# Patient Record
Sex: Female | Born: 1935
Health system: Southern US, Community
[De-identification: ages and names within clinical notes are randomized; demographics above are authoritative.]

## PROBLEM LIST (undated history)

## (undated) DIAGNOSIS — I509 Heart failure, unspecified: Secondary | ICD-10-CM

## (undated) DIAGNOSIS — K56609 Unspecified intestinal obstruction, unspecified as to partial versus complete obstruction: Secondary | ICD-10-CM

## (undated) DIAGNOSIS — N289 Disorder of kidney and ureter, unspecified: Secondary | ICD-10-CM

## (undated) DIAGNOSIS — Z87442 Personal history of urinary calculi: Secondary | ICD-10-CM

## (undated) DIAGNOSIS — Z950 Presence of cardiac pacemaker: Secondary | ICD-10-CM

## (undated) DIAGNOSIS — C801 Malignant (primary) neoplasm, unspecified: Secondary | ICD-10-CM

## (undated) HISTORY — PX: BREAST LUMPECTOMY: SHX2

## (undated) HISTORY — PX: GASTRIC BYPASS: SHX52

## (undated) HISTORY — PX: PACEMAKER IMPLANT: EP1218

## (undated) HISTORY — PX: BACK SURGERY: SHX140

---
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
## 2013-08-05 DIAGNOSIS — Z950 Presence of cardiac pacemaker: Secondary | ICD-10-CM

## 2013-08-05 HISTORY — DX: Presence of cardiac pacemaker: Z95.0

## 2014-06-07 HISTORY — PX: CARDIAC VALVE REPLACEMENT: SHX585

## 2015-06-08 HISTORY — PX: FRACTURE SURGERY: SHX138

## 2016-06-16 DIAGNOSIS — M546 Pain in thoracic spine: Secondary | ICD-10-CM | POA: Diagnosis not present

## 2016-06-16 DIAGNOSIS — M4134 Thoracogenic scoliosis, thoracic region: Secondary | ICD-10-CM | POA: Diagnosis not present

## 2016-06-21 DIAGNOSIS — M546 Pain in thoracic spine: Secondary | ICD-10-CM | POA: Diagnosis not present

## 2016-06-21 DIAGNOSIS — M4134 Thoracogenic scoliosis, thoracic region: Secondary | ICD-10-CM | POA: Diagnosis not present

## 2016-06-24 DIAGNOSIS — I872 Venous insufficiency (chronic) (peripheral): Secondary | ICD-10-CM | POA: Diagnosis not present

## 2016-06-24 DIAGNOSIS — K219 Gastro-esophageal reflux disease without esophagitis: Secondary | ICD-10-CM | POA: Diagnosis not present

## 2016-06-24 DIAGNOSIS — I482 Chronic atrial fibrillation: Secondary | ICD-10-CM | POA: Diagnosis not present

## 2016-06-24 DIAGNOSIS — I350 Nonrheumatic aortic (valve) stenosis: Secondary | ICD-10-CM | POA: Diagnosis not present

## 2016-06-24 DIAGNOSIS — Z7409 Other reduced mobility: Secondary | ICD-10-CM | POA: Diagnosis not present

## 2016-06-25 DIAGNOSIS — M4134 Thoracogenic scoliosis, thoracic region: Secondary | ICD-10-CM | POA: Diagnosis not present

## 2016-06-25 DIAGNOSIS — M546 Pain in thoracic spine: Secondary | ICD-10-CM | POA: Diagnosis not present

## 2016-06-30 DIAGNOSIS — I872 Venous insufficiency (chronic) (peripheral): Secondary | ICD-10-CM | POA: Diagnosis not present

## 2016-06-30 DIAGNOSIS — I482 Chronic atrial fibrillation: Secondary | ICD-10-CM | POA: Diagnosis not present

## 2016-06-30 DIAGNOSIS — L89102 Pressure ulcer of unspecified part of back, stage 2: Secondary | ICD-10-CM | POA: Diagnosis not present

## 2016-06-30 DIAGNOSIS — D510 Vitamin B12 deficiency anemia due to intrinsic factor deficiency: Secondary | ICD-10-CM | POA: Diagnosis not present

## 2016-06-30 DIAGNOSIS — R0602 Shortness of breath: Secondary | ICD-10-CM | POA: Diagnosis not present

## 2016-06-30 DIAGNOSIS — I350 Nonrheumatic aortic (valve) stenosis: Secondary | ICD-10-CM | POA: Diagnosis not present

## 2016-07-02 DIAGNOSIS — M546 Pain in thoracic spine: Secondary | ICD-10-CM | POA: Diagnosis not present

## 2016-07-02 DIAGNOSIS — M4134 Thoracogenic scoliosis, thoracic region: Secondary | ICD-10-CM | POA: Diagnosis not present

## 2016-07-07 DIAGNOSIS — M4134 Thoracogenic scoliosis, thoracic region: Secondary | ICD-10-CM | POA: Diagnosis not present

## 2016-07-07 DIAGNOSIS — I482 Chronic atrial fibrillation: Secondary | ICD-10-CM | POA: Diagnosis not present

## 2016-07-07 DIAGNOSIS — M546 Pain in thoracic spine: Secondary | ICD-10-CM | POA: Diagnosis not present

## 2016-07-21 DIAGNOSIS — I482 Chronic atrial fibrillation: Secondary | ICD-10-CM | POA: Diagnosis not present

## 2016-07-26 DIAGNOSIS — I482 Chronic atrial fibrillation: Secondary | ICD-10-CM | POA: Diagnosis not present

## 2016-07-26 DIAGNOSIS — I5032 Chronic diastolic (congestive) heart failure: Secondary | ICD-10-CM | POA: Diagnosis not present

## 2016-07-26 DIAGNOSIS — K219 Gastro-esophageal reflux disease without esophagitis: Secondary | ICD-10-CM | POA: Diagnosis not present

## 2016-07-26 DIAGNOSIS — D510 Vitamin B12 deficiency anemia due to intrinsic factor deficiency: Secondary | ICD-10-CM | POA: Diagnosis not present

## 2016-07-26 DIAGNOSIS — M79644 Pain in right finger(s): Secondary | ICD-10-CM | POA: Diagnosis not present

## 2016-08-11 DIAGNOSIS — G5601 Carpal tunnel syndrome, right upper limb: Secondary | ICD-10-CM | POA: Diagnosis not present

## 2016-08-11 DIAGNOSIS — M1811 Unilateral primary osteoarthritis of first carpometacarpal joint, right hand: Secondary | ICD-10-CM | POA: Diagnosis not present

## 2016-08-23 DIAGNOSIS — M79644 Pain in right finger(s): Secondary | ICD-10-CM | POA: Diagnosis not present

## 2016-08-23 DIAGNOSIS — K219 Gastro-esophageal reflux disease without esophagitis: Secondary | ICD-10-CM | POA: Diagnosis not present

## 2016-08-23 DIAGNOSIS — D510 Vitamin B12 deficiency anemia due to intrinsic factor deficiency: Secondary | ICD-10-CM | POA: Diagnosis not present

## 2016-08-23 DIAGNOSIS — I482 Chronic atrial fibrillation: Secondary | ICD-10-CM | POA: Diagnosis not present

## 2016-08-23 DIAGNOSIS — E785 Hyperlipidemia, unspecified: Secondary | ICD-10-CM | POA: Diagnosis not present

## 2016-08-31 DIAGNOSIS — I4891 Unspecified atrial fibrillation: Secondary | ICD-10-CM | POA: Diagnosis not present

## 2016-08-31 DIAGNOSIS — T454X5A Adverse effect of iron and its compounds, initial encounter: Secondary | ICD-10-CM | POA: Diagnosis not present

## 2016-08-31 DIAGNOSIS — D508 Other iron deficiency anemias: Secondary | ICD-10-CM | POA: Diagnosis not present

## 2016-08-31 DIAGNOSIS — Z7901 Long term (current) use of anticoagulants: Secondary | ICD-10-CM | POA: Diagnosis not present

## 2016-08-31 DIAGNOSIS — D509 Iron deficiency anemia, unspecified: Secondary | ICD-10-CM | POA: Diagnosis not present

## 2016-08-31 DIAGNOSIS — D519 Vitamin B12 deficiency anemia, unspecified: Secondary | ICD-10-CM | POA: Diagnosis not present

## 2016-08-31 DIAGNOSIS — Z950 Presence of cardiac pacemaker: Secondary | ICD-10-CM | POA: Diagnosis not present

## 2016-08-31 DIAGNOSIS — Z7983 Long term (current) use of bisphosphonates: Secondary | ICD-10-CM | POA: Diagnosis not present

## 2016-08-31 DIAGNOSIS — K9049 Malabsorption due to intolerance, not elsewhere classified: Secondary | ICD-10-CM | POA: Diagnosis not present

## 2016-08-31 DIAGNOSIS — M810 Age-related osteoporosis without current pathological fracture: Secondary | ICD-10-CM | POA: Diagnosis not present

## 2016-09-15 DIAGNOSIS — E785 Hyperlipidemia, unspecified: Secondary | ICD-10-CM | POA: Diagnosis not present

## 2016-09-15 DIAGNOSIS — N390 Urinary tract infection, site not specified: Secondary | ICD-10-CM | POA: Diagnosis not present

## 2016-09-15 DIAGNOSIS — M2550 Pain in unspecified joint: Secondary | ICD-10-CM | POA: Diagnosis not present

## 2016-09-15 DIAGNOSIS — D510 Vitamin B12 deficiency anemia due to intrinsic factor deficiency: Secondary | ICD-10-CM | POA: Diagnosis not present

## 2016-09-15 DIAGNOSIS — I482 Chronic atrial fibrillation: Secondary | ICD-10-CM | POA: Diagnosis not present

## 2016-09-15 DIAGNOSIS — K219 Gastro-esophageal reflux disease without esophagitis: Secondary | ICD-10-CM | POA: Diagnosis not present

## 2016-09-28 DIAGNOSIS — L578 Other skin changes due to chronic exposure to nonionizing radiation: Secondary | ICD-10-CM | POA: Diagnosis not present

## 2016-09-28 DIAGNOSIS — L821 Other seborrheic keratosis: Secondary | ICD-10-CM | POA: Diagnosis not present

## 2016-09-28 DIAGNOSIS — Z85828 Personal history of other malignant neoplasm of skin: Secondary | ICD-10-CM | POA: Diagnosis not present

## 2016-09-28 DIAGNOSIS — D485 Neoplasm of uncertain behavior of skin: Secondary | ICD-10-CM | POA: Diagnosis not present

## 2016-09-28 DIAGNOSIS — C44622 Squamous cell carcinoma of skin of right upper limb, including shoulder: Secondary | ICD-10-CM | POA: Diagnosis not present

## 2016-09-28 DIAGNOSIS — L853 Xerosis cutis: Secondary | ICD-10-CM | POA: Diagnosis not present

## 2016-09-28 DIAGNOSIS — C44329 Squamous cell carcinoma of skin of other parts of face: Secondary | ICD-10-CM | POA: Diagnosis not present

## 2016-09-28 DIAGNOSIS — L570 Actinic keratosis: Secondary | ICD-10-CM | POA: Diagnosis not present

## 2016-10-13 DIAGNOSIS — D510 Vitamin B12 deficiency anemia due to intrinsic factor deficiency: Secondary | ICD-10-CM | POA: Diagnosis not present

## 2016-10-13 DIAGNOSIS — M1811 Unilateral primary osteoarthritis of first carpometacarpal joint, right hand: Secondary | ICD-10-CM | POA: Diagnosis not present

## 2016-10-13 DIAGNOSIS — I482 Chronic atrial fibrillation: Secondary | ICD-10-CM | POA: Diagnosis not present

## 2016-10-13 DIAGNOSIS — I5032 Chronic diastolic (congestive) heart failure: Secondary | ICD-10-CM | POA: Diagnosis not present

## 2016-10-13 DIAGNOSIS — E785 Hyperlipidemia, unspecified: Secondary | ICD-10-CM | POA: Diagnosis not present

## 2016-10-13 DIAGNOSIS — K219 Gastro-esophageal reflux disease without esophagitis: Secondary | ICD-10-CM | POA: Diagnosis not present

## 2016-10-13 DIAGNOSIS — G5601 Carpal tunnel syndrome, right upper limb: Secondary | ICD-10-CM | POA: Diagnosis not present

## 2016-10-21 DIAGNOSIS — I482 Chronic atrial fibrillation: Secondary | ICD-10-CM | POA: Diagnosis not present

## 2016-10-21 DIAGNOSIS — I350 Nonrheumatic aortic (valve) stenosis: Secondary | ICD-10-CM | POA: Diagnosis not present

## 2016-10-21 DIAGNOSIS — I5032 Chronic diastolic (congestive) heart failure: Secondary | ICD-10-CM | POA: Diagnosis not present

## 2016-10-21 DIAGNOSIS — I10 Essential (primary) hypertension: Secondary | ICD-10-CM | POA: Diagnosis not present

## 2016-10-26 DIAGNOSIS — C44320 Squamous cell carcinoma of skin of unspecified parts of face: Secondary | ICD-10-CM | POA: Diagnosis not present

## 2016-10-27 DIAGNOSIS — M1811 Unilateral primary osteoarthritis of first carpometacarpal joint, right hand: Secondary | ICD-10-CM | POA: Diagnosis not present

## 2016-10-27 DIAGNOSIS — G5601 Carpal tunnel syndrome, right upper limb: Secondary | ICD-10-CM | POA: Diagnosis not present

## 2016-11-03 DIAGNOSIS — L905 Scar conditions and fibrosis of skin: Secondary | ICD-10-CM | POA: Diagnosis not present

## 2016-11-03 DIAGNOSIS — C44622 Squamous cell carcinoma of skin of right upper limb, including shoulder: Secondary | ICD-10-CM | POA: Diagnosis not present

## 2016-11-11 DIAGNOSIS — I482 Chronic atrial fibrillation: Secondary | ICD-10-CM | POA: Diagnosis not present

## 2016-11-11 DIAGNOSIS — K219 Gastro-esophageal reflux disease without esophagitis: Secondary | ICD-10-CM | POA: Diagnosis not present

## 2016-11-11 DIAGNOSIS — Z01818 Encounter for other preprocedural examination: Secondary | ICD-10-CM | POA: Diagnosis not present

## 2016-11-11 DIAGNOSIS — D510 Vitamin B12 deficiency anemia due to intrinsic factor deficiency: Secondary | ICD-10-CM | POA: Diagnosis not present

## 2016-11-11 DIAGNOSIS — E785 Hyperlipidemia, unspecified: Secondary | ICD-10-CM | POA: Diagnosis not present

## 2016-11-11 DIAGNOSIS — M810 Age-related osteoporosis without current pathological fracture: Secondary | ICD-10-CM | POA: Diagnosis not present

## 2016-11-11 DIAGNOSIS — I5032 Chronic diastolic (congestive) heart failure: Secondary | ICD-10-CM | POA: Diagnosis not present

## 2016-11-11 DIAGNOSIS — M2550 Pain in unspecified joint: Secondary | ICD-10-CM | POA: Diagnosis not present

## 2016-11-11 DIAGNOSIS — M1811 Unilateral primary osteoarthritis of first carpometacarpal joint, right hand: Secondary | ICD-10-CM | POA: Diagnosis not present

## 2016-11-17 DIAGNOSIS — G5601 Carpal tunnel syndrome, right upper limb: Secondary | ICD-10-CM | POA: Diagnosis not present

## 2016-11-17 DIAGNOSIS — M1811 Unilateral primary osteoarthritis of first carpometacarpal joint, right hand: Secondary | ICD-10-CM | POA: Diagnosis not present

## 2016-11-23 DIAGNOSIS — Z538 Procedure and treatment not carried out for other reasons: Secondary | ICD-10-CM | POA: Diagnosis not present

## 2016-11-23 DIAGNOSIS — M1990 Unspecified osteoarthritis, unspecified site: Secondary | ICD-10-CM | POA: Diagnosis not present

## 2016-12-06 DIAGNOSIS — D510 Vitamin B12 deficiency anemia due to intrinsic factor deficiency: Secondary | ICD-10-CM | POA: Diagnosis not present

## 2016-12-06 DIAGNOSIS — I5032 Chronic diastolic (congestive) heart failure: Secondary | ICD-10-CM | POA: Diagnosis not present

## 2016-12-06 DIAGNOSIS — I482 Chronic atrial fibrillation: Secondary | ICD-10-CM | POA: Diagnosis not present

## 2016-12-06 DIAGNOSIS — K219 Gastro-esophageal reflux disease without esophagitis: Secondary | ICD-10-CM | POA: Diagnosis not present

## 2016-12-07 DIAGNOSIS — I110 Hypertensive heart disease with heart failure: Secondary | ICD-10-CM | POA: Diagnosis not present

## 2016-12-07 DIAGNOSIS — M898X4 Other specified disorders of bone, hand: Secondary | ICD-10-CM | POA: Diagnosis not present

## 2016-12-07 DIAGNOSIS — G8918 Other acute postprocedural pain: Secondary | ICD-10-CM | POA: Diagnosis not present

## 2016-12-07 DIAGNOSIS — M654 Radial styloid tenosynovitis [de Quervain]: Secondary | ICD-10-CM | POA: Diagnosis not present

## 2016-12-07 DIAGNOSIS — I509 Heart failure, unspecified: Secondary | ICD-10-CM | POA: Diagnosis not present

## 2016-12-07 DIAGNOSIS — M1811 Unilateral primary osteoarthritis of first carpometacarpal joint, right hand: Secondary | ICD-10-CM | POA: Diagnosis not present

## 2016-12-29 DIAGNOSIS — Z4889 Encounter for other specified surgical aftercare: Secondary | ICD-10-CM | POA: Diagnosis not present

## 2017-01-03 DIAGNOSIS — M79641 Pain in right hand: Secondary | ICD-10-CM | POA: Diagnosis not present

## 2017-01-03 DIAGNOSIS — H6983 Other specified disorders of Eustachian tube, bilateral: Secondary | ICD-10-CM | POA: Diagnosis not present

## 2017-01-03 DIAGNOSIS — M25641 Stiffness of right hand, not elsewhere classified: Secondary | ICD-10-CM | POA: Diagnosis not present

## 2017-01-03 DIAGNOSIS — I482 Chronic atrial fibrillation: Secondary | ICD-10-CM | POA: Diagnosis not present

## 2017-01-03 DIAGNOSIS — D510 Vitamin B12 deficiency anemia due to intrinsic factor deficiency: Secondary | ICD-10-CM | POA: Diagnosis not present

## 2017-01-03 DIAGNOSIS — M79644 Pain in right finger(s): Secondary | ICD-10-CM | POA: Diagnosis not present

## 2017-01-03 DIAGNOSIS — I5032 Chronic diastolic (congestive) heart failure: Secondary | ICD-10-CM | POA: Diagnosis not present

## 2017-01-03 DIAGNOSIS — Z6825 Body mass index (BMI) 25.0-25.9, adult: Secondary | ICD-10-CM | POA: Diagnosis not present

## 2017-01-03 DIAGNOSIS — M1811 Unilateral primary osteoarthritis of first carpometacarpal joint, right hand: Secondary | ICD-10-CM | POA: Diagnosis not present

## 2017-01-03 DIAGNOSIS — K219 Gastro-esophageal reflux disease without esophagitis: Secondary | ICD-10-CM | POA: Diagnosis not present

## 2017-01-12 DIAGNOSIS — M25641 Stiffness of right hand, not elsewhere classified: Secondary | ICD-10-CM | POA: Diagnosis not present

## 2017-01-12 DIAGNOSIS — M1811 Unilateral primary osteoarthritis of first carpometacarpal joint, right hand: Secondary | ICD-10-CM | POA: Diagnosis not present

## 2017-01-12 DIAGNOSIS — M79641 Pain in right hand: Secondary | ICD-10-CM | POA: Diagnosis not present

## 2017-01-12 DIAGNOSIS — M79644 Pain in right finger(s): Secondary | ICD-10-CM | POA: Diagnosis not present

## 2017-01-19 DIAGNOSIS — M79644 Pain in right finger(s): Secondary | ICD-10-CM | POA: Diagnosis not present

## 2017-01-19 DIAGNOSIS — M1811 Unilateral primary osteoarthritis of first carpometacarpal joint, right hand: Secondary | ICD-10-CM | POA: Diagnosis not present

## 2017-01-19 DIAGNOSIS — M25641 Stiffness of right hand, not elsewhere classified: Secondary | ICD-10-CM | POA: Diagnosis not present

## 2017-01-19 DIAGNOSIS — M79641 Pain in right hand: Secondary | ICD-10-CM | POA: Diagnosis not present

## 2017-01-25 DIAGNOSIS — M1811 Unilateral primary osteoarthritis of first carpometacarpal joint, right hand: Secondary | ICD-10-CM | POA: Diagnosis not present

## 2017-01-25 DIAGNOSIS — M79641 Pain in right hand: Secondary | ICD-10-CM | POA: Diagnosis not present

## 2017-01-25 DIAGNOSIS — M25641 Stiffness of right hand, not elsewhere classified: Secondary | ICD-10-CM | POA: Diagnosis not present

## 2017-01-25 DIAGNOSIS — M79644 Pain in right finger(s): Secondary | ICD-10-CM | POA: Diagnosis not present

## 2017-01-26 DIAGNOSIS — L812 Freckles: Secondary | ICD-10-CM | POA: Diagnosis not present

## 2017-01-26 DIAGNOSIS — C44629 Squamous cell carcinoma of skin of left upper limb, including shoulder: Secondary | ICD-10-CM | POA: Diagnosis not present

## 2017-01-26 DIAGNOSIS — L570 Actinic keratosis: Secondary | ICD-10-CM | POA: Diagnosis not present

## 2017-01-26 DIAGNOSIS — C44329 Squamous cell carcinoma of skin of other parts of face: Secondary | ICD-10-CM | POA: Diagnosis not present

## 2017-01-26 DIAGNOSIS — C44219 Basal cell carcinoma of skin of left ear and external auricular canal: Secondary | ICD-10-CM | POA: Diagnosis not present

## 2017-01-26 DIAGNOSIS — L821 Other seborrheic keratosis: Secondary | ICD-10-CM | POA: Diagnosis not present

## 2017-01-26 DIAGNOSIS — Z85828 Personal history of other malignant neoplasm of skin: Secondary | ICD-10-CM | POA: Diagnosis not present

## 2017-01-26 DIAGNOSIS — L578 Other skin changes due to chronic exposure to nonionizing radiation: Secondary | ICD-10-CM | POA: Diagnosis not present

## 2017-01-26 DIAGNOSIS — D485 Neoplasm of uncertain behavior of skin: Secondary | ICD-10-CM | POA: Diagnosis not present

## 2017-01-31 DIAGNOSIS — I482 Chronic atrial fibrillation: Secondary | ICD-10-CM | POA: Diagnosis not present

## 2017-01-31 DIAGNOSIS — L89102 Pressure ulcer of unspecified part of back, stage 2: Secondary | ICD-10-CM | POA: Diagnosis not present

## 2017-01-31 DIAGNOSIS — I5032 Chronic diastolic (congestive) heart failure: Secondary | ICD-10-CM | POA: Diagnosis not present

## 2017-01-31 DIAGNOSIS — K219 Gastro-esophageal reflux disease without esophagitis: Secondary | ICD-10-CM | POA: Diagnosis not present

## 2017-01-31 DIAGNOSIS — Z6825 Body mass index (BMI) 25.0-25.9, adult: Secondary | ICD-10-CM | POA: Diagnosis not present

## 2017-01-31 DIAGNOSIS — Z0000 Encounter for general adult medical examination without abnormal findings: Secondary | ICD-10-CM | POA: Diagnosis not present

## 2017-01-31 DIAGNOSIS — D510 Vitamin B12 deficiency anemia due to intrinsic factor deficiency: Secondary | ICD-10-CM | POA: Diagnosis not present

## 2017-03-01 DIAGNOSIS — K9049 Malabsorption due to intolerance, not elsewhere classified: Secondary | ICD-10-CM | POA: Diagnosis not present

## 2017-03-01 DIAGNOSIS — D508 Other iron deficiency anemias: Secondary | ICD-10-CM | POA: Diagnosis not present

## 2017-03-01 DIAGNOSIS — M810 Age-related osteoporosis without current pathological fracture: Secondary | ICD-10-CM | POA: Diagnosis not present

## 2017-03-01 DIAGNOSIS — T454X5A Adverse effect of iron and its compounds, initial encounter: Secondary | ICD-10-CM | POA: Diagnosis not present

## 2017-03-02 DIAGNOSIS — I482 Chronic atrial fibrillation: Secondary | ICD-10-CM | POA: Diagnosis not present

## 2017-03-02 DIAGNOSIS — K219 Gastro-esophageal reflux disease without esophagitis: Secondary | ICD-10-CM | POA: Diagnosis not present

## 2017-03-02 DIAGNOSIS — E785 Hyperlipidemia, unspecified: Secondary | ICD-10-CM | POA: Diagnosis not present

## 2017-03-02 DIAGNOSIS — D510 Vitamin B12 deficiency anemia due to intrinsic factor deficiency: Secondary | ICD-10-CM | POA: Diagnosis not present

## 2017-03-02 DIAGNOSIS — I5032 Chronic diastolic (congestive) heart failure: Secondary | ICD-10-CM | POA: Diagnosis not present

## 2017-03-03 DIAGNOSIS — I350 Nonrheumatic aortic (valve) stenosis: Secondary | ICD-10-CM | POA: Diagnosis not present

## 2017-03-03 DIAGNOSIS — I482 Chronic atrial fibrillation: Secondary | ICD-10-CM | POA: Diagnosis not present

## 2017-03-03 DIAGNOSIS — I442 Atrioventricular block, complete: Secondary | ICD-10-CM | POA: Diagnosis not present

## 2017-03-03 DIAGNOSIS — I951 Orthostatic hypotension: Secondary | ICD-10-CM | POA: Diagnosis not present

## 2017-03-09 DIAGNOSIS — C44219 Basal cell carcinoma of skin of left ear and external auricular canal: Secondary | ICD-10-CM | POA: Diagnosis not present

## 2017-03-16 DIAGNOSIS — C44320 Squamous cell carcinoma of skin of unspecified parts of face: Secondary | ICD-10-CM | POA: Diagnosis not present

## 2017-03-18 DIAGNOSIS — Z780 Asymptomatic menopausal state: Secondary | ICD-10-CM | POA: Diagnosis not present

## 2017-03-23 DIAGNOSIS — D485 Neoplasm of uncertain behavior of skin: Secondary | ICD-10-CM | POA: Diagnosis not present

## 2017-03-23 DIAGNOSIS — L905 Scar conditions and fibrosis of skin: Secondary | ICD-10-CM | POA: Diagnosis not present

## 2017-03-23 DIAGNOSIS — C44629 Squamous cell carcinoma of skin of left upper limb, including shoulder: Secondary | ICD-10-CM | POA: Diagnosis not present

## 2017-03-30 DIAGNOSIS — M25641 Stiffness of right hand, not elsewhere classified: Secondary | ICD-10-CM | POA: Diagnosis not present

## 2017-03-30 DIAGNOSIS — M1811 Unilateral primary osteoarthritis of first carpometacarpal joint, right hand: Secondary | ICD-10-CM | POA: Diagnosis not present

## 2017-03-30 DIAGNOSIS — M79641 Pain in right hand: Secondary | ICD-10-CM | POA: Diagnosis not present

## 2017-03-30 DIAGNOSIS — M79644 Pain in right finger(s): Secondary | ICD-10-CM | POA: Diagnosis not present

## 2017-03-31 DIAGNOSIS — I5032 Chronic diastolic (congestive) heart failure: Secondary | ICD-10-CM | POA: Diagnosis not present

## 2017-03-31 DIAGNOSIS — M2550 Pain in unspecified joint: Secondary | ICD-10-CM | POA: Diagnosis not present

## 2017-03-31 DIAGNOSIS — G4700 Insomnia, unspecified: Secondary | ICD-10-CM | POA: Diagnosis not present

## 2017-03-31 DIAGNOSIS — M810 Age-related osteoporosis without current pathological fracture: Secondary | ICD-10-CM | POA: Diagnosis not present

## 2017-03-31 DIAGNOSIS — D510 Vitamin B12 deficiency anemia due to intrinsic factor deficiency: Secondary | ICD-10-CM | POA: Diagnosis not present

## 2017-03-31 DIAGNOSIS — L299 Pruritus, unspecified: Secondary | ICD-10-CM | POA: Diagnosis not present

## 2017-03-31 DIAGNOSIS — I482 Chronic atrial fibrillation: Secondary | ICD-10-CM | POA: Diagnosis not present

## 2017-03-31 DIAGNOSIS — K219 Gastro-esophageal reflux disease without esophagitis: Secondary | ICD-10-CM | POA: Diagnosis not present

## 2017-04-26 DIAGNOSIS — I482 Chronic atrial fibrillation: Secondary | ICD-10-CM | POA: Diagnosis not present

## 2017-04-26 DIAGNOSIS — D510 Vitamin B12 deficiency anemia due to intrinsic factor deficiency: Secondary | ICD-10-CM | POA: Diagnosis not present

## 2017-04-26 DIAGNOSIS — K219 Gastro-esophageal reflux disease without esophagitis: Secondary | ICD-10-CM | POA: Diagnosis not present

## 2017-05-16 DIAGNOSIS — C44629 Squamous cell carcinoma of skin of left upper limb, including shoulder: Secondary | ICD-10-CM | POA: Diagnosis not present

## 2017-06-02 DIAGNOSIS — M40204 Unspecified kyphosis, thoracic region: Secondary | ICD-10-CM | POA: Diagnosis not present

## 2017-06-02 DIAGNOSIS — N200 Calculus of kidney: Secondary | ICD-10-CM | POA: Diagnosis not present

## 2017-06-02 DIAGNOSIS — J189 Pneumonia, unspecified organism: Secondary | ICD-10-CM | POA: Diagnosis not present

## 2017-06-02 DIAGNOSIS — R509 Fever, unspecified: Secondary | ICD-10-CM | POA: Diagnosis not present

## 2017-06-02 DIAGNOSIS — Z9049 Acquired absence of other specified parts of digestive tract: Secondary | ICD-10-CM | POA: Diagnosis not present

## 2017-06-02 DIAGNOSIS — R109 Unspecified abdominal pain: Secondary | ICD-10-CM | POA: Diagnosis not present

## 2017-06-02 DIAGNOSIS — R05 Cough: Secondary | ICD-10-CM | POA: Diagnosis not present

## 2017-06-02 DIAGNOSIS — Z950 Presence of cardiac pacemaker: Secondary | ICD-10-CM | POA: Diagnosis not present

## 2017-06-04 DIAGNOSIS — J189 Pneumonia, unspecified organism: Secondary | ICD-10-CM | POA: Diagnosis not present

## 2017-06-04 DIAGNOSIS — R1084 Generalized abdominal pain: Secondary | ICD-10-CM | POA: Diagnosis not present

## 2017-06-05 DIAGNOSIS — I442 Atrioventricular block, complete: Secondary | ICD-10-CM | POA: Diagnosis not present

## 2017-06-05 DIAGNOSIS — Z950 Presence of cardiac pacemaker: Secondary | ICD-10-CM | POA: Diagnosis not present

## 2017-06-06 DIAGNOSIS — R600 Localized edema: Secondary | ICD-10-CM | POA: Diagnosis not present

## 2017-06-06 DIAGNOSIS — I482 Chronic atrial fibrillation: Secondary | ICD-10-CM | POA: Diagnosis not present

## 2017-06-06 DIAGNOSIS — J181 Lobar pneumonia, unspecified organism: Secondary | ICD-10-CM | POA: Diagnosis not present

## 2017-07-18 DIAGNOSIS — I482 Chronic atrial fibrillation: Secondary | ICD-10-CM | POA: Diagnosis not present

## 2017-07-26 DIAGNOSIS — L820 Inflamed seborrheic keratosis: Secondary | ICD-10-CM | POA: Diagnosis not present

## 2017-07-26 DIAGNOSIS — C44622 Squamous cell carcinoma of skin of right upper limb, including shoulder: Secondary | ICD-10-CM | POA: Diagnosis not present

## 2017-07-26 DIAGNOSIS — D485 Neoplasm of uncertain behavior of skin: Secondary | ICD-10-CM | POA: Diagnosis not present

## 2017-07-26 DIAGNOSIS — Z85828 Personal history of other malignant neoplasm of skin: Secondary | ICD-10-CM | POA: Diagnosis not present

## 2017-07-26 DIAGNOSIS — C44629 Squamous cell carcinoma of skin of left upper limb, including shoulder: Secondary | ICD-10-CM | POA: Diagnosis not present

## 2017-07-26 DIAGNOSIS — C44329 Squamous cell carcinoma of skin of other parts of face: Secondary | ICD-10-CM | POA: Diagnosis not present

## 2017-07-26 DIAGNOSIS — L821 Other seborrheic keratosis: Secondary | ICD-10-CM | POA: Diagnosis not present

## 2017-07-26 DIAGNOSIS — L853 Xerosis cutis: Secondary | ICD-10-CM | POA: Diagnosis not present

## 2017-07-26 DIAGNOSIS — L570 Actinic keratosis: Secondary | ICD-10-CM | POA: Diagnosis not present

## 2017-07-26 DIAGNOSIS — L578 Other skin changes due to chronic exposure to nonionizing radiation: Secondary | ICD-10-CM | POA: Diagnosis not present

## 2017-07-27 DIAGNOSIS — Z9049 Acquired absence of other specified parts of digestive tract: Secondary | ICD-10-CM | POA: Diagnosis not present

## 2017-07-27 DIAGNOSIS — R1012 Left upper quadrant pain: Secondary | ICD-10-CM | POA: Diagnosis not present

## 2017-07-27 DIAGNOSIS — R110 Nausea: Secondary | ICD-10-CM | POA: Diagnosis not present

## 2017-07-27 DIAGNOSIS — I700 Atherosclerosis of aorta: Secondary | ICD-10-CM | POA: Diagnosis not present

## 2017-07-27 DIAGNOSIS — R112 Nausea with vomiting, unspecified: Secondary | ICD-10-CM | POA: Diagnosis not present

## 2017-07-27 DIAGNOSIS — R1013 Epigastric pain: Secondary | ICD-10-CM | POA: Diagnosis not present

## 2017-07-29 DIAGNOSIS — K219 Gastro-esophageal reflux disease without esophagitis: Secondary | ICD-10-CM | POA: Diagnosis not present

## 2017-07-29 DIAGNOSIS — R1012 Left upper quadrant pain: Secondary | ICD-10-CM | POA: Diagnosis not present

## 2017-07-29 DIAGNOSIS — R110 Nausea: Secondary | ICD-10-CM | POA: Diagnosis not present

## 2017-07-29 DIAGNOSIS — K56609 Unspecified intestinal obstruction, unspecified as to partial versus complete obstruction: Secondary | ICD-10-CM | POA: Diagnosis not present

## 2017-08-04 DIAGNOSIS — L570 Actinic keratosis: Secondary | ICD-10-CM | POA: Diagnosis not present

## 2017-08-10 DIAGNOSIS — R110 Nausea: Secondary | ICD-10-CM | POA: Diagnosis not present

## 2017-08-10 DIAGNOSIS — R634 Abnormal weight loss: Secondary | ICD-10-CM | POA: Diagnosis not present

## 2017-08-10 DIAGNOSIS — R1012 Left upper quadrant pain: Secondary | ICD-10-CM | POA: Diagnosis not present

## 2017-08-10 DIAGNOSIS — K219 Gastro-esophageal reflux disease without esophagitis: Secondary | ICD-10-CM | POA: Diagnosis not present

## 2017-08-10 DIAGNOSIS — K56609 Unspecified intestinal obstruction, unspecified as to partial versus complete obstruction: Secondary | ICD-10-CM | POA: Diagnosis not present

## 2017-08-12 DIAGNOSIS — Z9884 Bariatric surgery status: Secondary | ICD-10-CM | POA: Diagnosis not present

## 2017-08-12 DIAGNOSIS — K56609 Unspecified intestinal obstruction, unspecified as to partial versus complete obstruction: Secondary | ICD-10-CM | POA: Diagnosis not present

## 2017-08-12 DIAGNOSIS — K219 Gastro-esophageal reflux disease without esophagitis: Secondary | ICD-10-CM | POA: Diagnosis not present

## 2017-08-12 DIAGNOSIS — K449 Diaphragmatic hernia without obstruction or gangrene: Secondary | ICD-10-CM | POA: Diagnosis not present

## 2017-08-12 DIAGNOSIS — R634 Abnormal weight loss: Secondary | ICD-10-CM | POA: Diagnosis not present

## 2017-08-23 DIAGNOSIS — Z9884 Bariatric surgery status: Secondary | ICD-10-CM | POA: Diagnosis not present

## 2017-08-23 DIAGNOSIS — Z713 Dietary counseling and surveillance: Secondary | ICD-10-CM | POA: Diagnosis not present

## 2017-08-24 DIAGNOSIS — M810 Age-related osteoporosis without current pathological fracture: Secondary | ICD-10-CM | POA: Diagnosis not present

## 2017-08-24 DIAGNOSIS — Z9884 Bariatric surgery status: Secondary | ICD-10-CM | POA: Diagnosis not present

## 2017-08-29 DIAGNOSIS — R1310 Dysphagia, unspecified: Secondary | ICD-10-CM | POA: Diagnosis not present

## 2017-08-29 DIAGNOSIS — R1013 Epigastric pain: Secondary | ICD-10-CM | POA: Diagnosis not present

## 2017-08-30 DIAGNOSIS — Z79899 Other long term (current) drug therapy: Secondary | ICD-10-CM | POA: Diagnosis not present

## 2017-08-30 DIAGNOSIS — M810 Age-related osteoporosis without current pathological fracture: Secondary | ICD-10-CM | POA: Diagnosis not present

## 2017-08-30 DIAGNOSIS — L299 Pruritus, unspecified: Secondary | ICD-10-CM | POA: Diagnosis not present

## 2017-08-30 DIAGNOSIS — Z862 Personal history of diseases of the blood and blood-forming organs and certain disorders involving the immune mechanism: Secondary | ICD-10-CM | POA: Diagnosis not present

## 2017-08-31 DIAGNOSIS — M25461 Effusion, right knee: Secondary | ICD-10-CM | POA: Diagnosis not present

## 2017-08-31 DIAGNOSIS — M25561 Pain in right knee: Secondary | ICD-10-CM | POA: Diagnosis not present

## 2017-09-01 DIAGNOSIS — M810 Age-related osteoporosis without current pathological fracture: Secondary | ICD-10-CM | POA: Diagnosis not present

## 2017-09-01 DIAGNOSIS — R600 Localized edema: Secondary | ICD-10-CM | POA: Diagnosis not present

## 2017-09-01 DIAGNOSIS — M7121 Synovial cyst of popliteal space [Baker], right knee: Secondary | ICD-10-CM | POA: Diagnosis not present

## 2017-09-01 DIAGNOSIS — Z6825 Body mass index (BMI) 25.0-25.9, adult: Secondary | ICD-10-CM | POA: Diagnosis not present

## 2017-09-01 DIAGNOSIS — L309 Dermatitis, unspecified: Secondary | ICD-10-CM | POA: Diagnosis not present

## 2017-09-01 DIAGNOSIS — L299 Pruritus, unspecified: Secondary | ICD-10-CM | POA: Diagnosis not present

## 2017-09-01 DIAGNOSIS — K219 Gastro-esophageal reflux disease without esophagitis: Secondary | ICD-10-CM | POA: Diagnosis not present

## 2017-09-01 DIAGNOSIS — I5032 Chronic diastolic (congestive) heart failure: Secondary | ICD-10-CM | POA: Diagnosis not present

## 2017-09-01 DIAGNOSIS — I482 Chronic atrial fibrillation: Secondary | ICD-10-CM | POA: Diagnosis not present

## 2017-09-01 DIAGNOSIS — D510 Vitamin B12 deficiency anemia due to intrinsic factor deficiency: Secondary | ICD-10-CM | POA: Diagnosis not present

## 2017-09-01 DIAGNOSIS — J181 Lobar pneumonia, unspecified organism: Secondary | ICD-10-CM | POA: Diagnosis not present

## 2017-09-02 DIAGNOSIS — H1045 Other chronic allergic conjunctivitis: Secondary | ICD-10-CM | POA: Diagnosis not present

## 2017-09-02 DIAGNOSIS — H10023 Other mucopurulent conjunctivitis, bilateral: Secondary | ICD-10-CM | POA: Diagnosis not present

## 2017-09-04 DIAGNOSIS — I442 Atrioventricular block, complete: Secondary | ICD-10-CM | POA: Diagnosis not present

## 2017-09-04 DIAGNOSIS — Z950 Presence of cardiac pacemaker: Secondary | ICD-10-CM | POA: Diagnosis not present

## 2017-09-07 DIAGNOSIS — R1310 Dysphagia, unspecified: Secondary | ICD-10-CM | POA: Diagnosis not present

## 2017-09-07 DIAGNOSIS — D649 Anemia, unspecified: Secondary | ICD-10-CM | POA: Diagnosis not present

## 2017-09-07 DIAGNOSIS — I509 Heart failure, unspecified: Secondary | ICD-10-CM | POA: Diagnosis not present

## 2017-09-08 DIAGNOSIS — I509 Heart failure, unspecified: Secondary | ICD-10-CM | POA: Diagnosis not present

## 2017-09-08 DIAGNOSIS — I4891 Unspecified atrial fibrillation: Secondary | ICD-10-CM | POA: Diagnosis not present

## 2017-09-08 DIAGNOSIS — Z9884 Bariatric surgery status: Secondary | ICD-10-CM | POA: Diagnosis not present

## 2017-09-08 DIAGNOSIS — Z950 Presence of cardiac pacemaker: Secondary | ICD-10-CM | POA: Diagnosis not present

## 2017-09-08 DIAGNOSIS — R1310 Dysphagia, unspecified: Secondary | ICD-10-CM | POA: Diagnosis not present

## 2017-09-08 DIAGNOSIS — R1319 Other dysphagia: Secondary | ICD-10-CM | POA: Diagnosis not present

## 2017-09-14 DIAGNOSIS — C44622 Squamous cell carcinoma of skin of right upper limb, including shoulder: Secondary | ICD-10-CM | POA: Diagnosis not present

## 2017-09-16 DIAGNOSIS — H1045 Other chronic allergic conjunctivitis: Secondary | ICD-10-CM | POA: Diagnosis not present

## 2017-09-16 DIAGNOSIS — Z961 Presence of intraocular lens: Secondary | ICD-10-CM | POA: Diagnosis not present

## 2017-09-16 DIAGNOSIS — H43813 Vitreous degeneration, bilateral: Secondary | ICD-10-CM | POA: Diagnosis not present

## 2017-09-16 DIAGNOSIS — H52223 Regular astigmatism, bilateral: Secondary | ICD-10-CM | POA: Diagnosis not present

## 2017-09-16 DIAGNOSIS — H04123 Dry eye syndrome of bilateral lacrimal glands: Secondary | ICD-10-CM | POA: Diagnosis not present

## 2017-09-21 DIAGNOSIS — C44320 Squamous cell carcinoma of skin of unspecified parts of face: Secondary | ICD-10-CM | POA: Diagnosis not present

## 2017-09-26 DIAGNOSIS — L03818 Cellulitis of other sites: Secondary | ICD-10-CM | POA: Diagnosis not present

## 2017-09-26 DIAGNOSIS — I482 Chronic atrial fibrillation: Secondary | ICD-10-CM | POA: Diagnosis not present

## 2017-09-26 DIAGNOSIS — D510 Vitamin B12 deficiency anemia due to intrinsic factor deficiency: Secondary | ICD-10-CM | POA: Diagnosis not present

## 2017-09-28 DIAGNOSIS — C44629 Squamous cell carcinoma of skin of left upper limb, including shoulder: Secondary | ICD-10-CM | POA: Diagnosis not present

## 2017-09-28 DIAGNOSIS — L905 Scar conditions and fibrosis of skin: Secondary | ICD-10-CM | POA: Diagnosis not present

## 2017-09-29 DIAGNOSIS — M25461 Effusion, right knee: Secondary | ICD-10-CM | POA: Diagnosis not present

## 2017-09-29 DIAGNOSIS — M25561 Pain in right knee: Secondary | ICD-10-CM | POA: Diagnosis not present

## 2017-10-09 DIAGNOSIS — H538 Other visual disturbances: Secondary | ICD-10-CM | POA: Diagnosis not present

## 2017-10-09 DIAGNOSIS — R0602 Shortness of breath: Secondary | ICD-10-CM | POA: Diagnosis not present

## 2017-10-09 DIAGNOSIS — I517 Cardiomegaly: Secondary | ICD-10-CM | POA: Diagnosis not present

## 2017-10-09 DIAGNOSIS — R911 Solitary pulmonary nodule: Secondary | ICD-10-CM | POA: Diagnosis not present

## 2017-10-09 DIAGNOSIS — H539 Unspecified visual disturbance: Secondary | ICD-10-CM | POA: Diagnosis not present

## 2017-10-09 DIAGNOSIS — R918 Other nonspecific abnormal finding of lung field: Secondary | ICD-10-CM | POA: Diagnosis not present

## 2017-10-09 DIAGNOSIS — R188 Other ascites: Secondary | ICD-10-CM | POA: Diagnosis not present

## 2017-10-09 DIAGNOSIS — I672 Cerebral atherosclerosis: Secondary | ICD-10-CM | POA: Diagnosis not present

## 2017-10-09 DIAGNOSIS — I6523 Occlusion and stenosis of bilateral carotid arteries: Secondary | ICD-10-CM | POA: Diagnosis not present

## 2017-10-09 DIAGNOSIS — Z9884 Bariatric surgery status: Secondary | ICD-10-CM | POA: Diagnosis not present

## 2017-10-09 DIAGNOSIS — I350 Nonrheumatic aortic (valve) stenosis: Secondary | ICD-10-CM | POA: Diagnosis not present

## 2017-10-09 DIAGNOSIS — G319 Degenerative disease of nervous system, unspecified: Secondary | ICD-10-CM | POA: Diagnosis not present

## 2017-10-09 DIAGNOSIS — R110 Nausea: Secondary | ICD-10-CM | POA: Diagnosis not present

## 2017-10-09 DIAGNOSIS — R42 Dizziness and giddiness: Secondary | ICD-10-CM | POA: Diagnosis not present

## 2017-10-09 DIAGNOSIS — I4891 Unspecified atrial fibrillation: Secondary | ICD-10-CM | POA: Diagnosis not present

## 2017-10-09 DIAGNOSIS — Z7901 Long term (current) use of anticoagulants: Secondary | ICD-10-CM | POA: Diagnosis not present

## 2017-10-09 DIAGNOSIS — R5383 Other fatigue: Secondary | ICD-10-CM | POA: Diagnosis not present

## 2017-10-09 DIAGNOSIS — R0789 Other chest pain: Secondary | ICD-10-CM | POA: Diagnosis not present

## 2017-10-09 DIAGNOSIS — R531 Weakness: Secondary | ICD-10-CM | POA: Diagnosis not present

## 2017-10-09 DIAGNOSIS — Z950 Presence of cardiac pacemaker: Secondary | ICD-10-CM | POA: Diagnosis not present

## 2017-10-09 DIAGNOSIS — R55 Syncope and collapse: Secondary | ICD-10-CM | POA: Diagnosis not present

## 2017-10-09 DIAGNOSIS — Z952 Presence of prosthetic heart valve: Secondary | ICD-10-CM | POA: Diagnosis not present

## 2017-10-10 DIAGNOSIS — R0789 Other chest pain: Secondary | ICD-10-CM | POA: Diagnosis not present

## 2017-10-10 DIAGNOSIS — J984 Other disorders of lung: Secondary | ICD-10-CM | POA: Diagnosis not present

## 2017-10-10 DIAGNOSIS — I350 Nonrheumatic aortic (valve) stenosis: Secondary | ICD-10-CM | POA: Diagnosis not present

## 2017-10-10 DIAGNOSIS — R531 Weakness: Secondary | ICD-10-CM | POA: Diagnosis not present

## 2017-10-10 DIAGNOSIS — I081 Rheumatic disorders of both mitral and tricuspid valves: Secondary | ICD-10-CM | POA: Diagnosis not present

## 2017-10-10 DIAGNOSIS — R911 Solitary pulmonary nodule: Secondary | ICD-10-CM | POA: Diagnosis not present

## 2017-10-10 DIAGNOSIS — R42 Dizziness and giddiness: Secondary | ICD-10-CM | POA: Diagnosis not present

## 2017-10-10 DIAGNOSIS — R1310 Dysphagia, unspecified: Secondary | ICD-10-CM | POA: Diagnosis not present

## 2017-10-10 DIAGNOSIS — I4891 Unspecified atrial fibrillation: Secondary | ICD-10-CM | POA: Diagnosis not present

## 2017-10-10 DIAGNOSIS — R918 Other nonspecific abnormal finding of lung field: Secondary | ICD-10-CM | POA: Diagnosis not present

## 2017-10-10 DIAGNOSIS — Z952 Presence of prosthetic heart valve: Secondary | ICD-10-CM | POA: Diagnosis not present

## 2017-10-13 DIAGNOSIS — R42 Dizziness and giddiness: Secondary | ICD-10-CM | POA: Diagnosis not present

## 2017-10-13 DIAGNOSIS — I482 Chronic atrial fibrillation: Secondary | ICD-10-CM | POA: Diagnosis not present

## 2017-10-13 DIAGNOSIS — H8110 Benign paroxysmal vertigo, unspecified ear: Secondary | ICD-10-CM | POA: Diagnosis not present

## 2017-10-13 DIAGNOSIS — Z6824 Body mass index (BMI) 24.0-24.9, adult: Secondary | ICD-10-CM | POA: Diagnosis not present

## 2017-10-13 DIAGNOSIS — R918 Other nonspecific abnormal finding of lung field: Secondary | ICD-10-CM | POA: Diagnosis not present

## 2017-10-17 DIAGNOSIS — R1310 Dysphagia, unspecified: Secondary | ICD-10-CM | POA: Diagnosis not present

## 2017-10-24 DIAGNOSIS — L03818 Cellulitis of other sites: Secondary | ICD-10-CM | POA: Diagnosis not present

## 2017-10-24 DIAGNOSIS — L309 Dermatitis, unspecified: Secondary | ICD-10-CM | POA: Diagnosis not present

## 2017-10-24 DIAGNOSIS — D510 Vitamin B12 deficiency anemia due to intrinsic factor deficiency: Secondary | ICD-10-CM | POA: Diagnosis not present

## 2017-10-24 DIAGNOSIS — Z79899 Other long term (current) drug therapy: Secondary | ICD-10-CM | POA: Diagnosis not present

## 2017-10-24 DIAGNOSIS — E785 Hyperlipidemia, unspecified: Secondary | ICD-10-CM | POA: Diagnosis not present

## 2017-10-24 DIAGNOSIS — I5032 Chronic diastolic (congestive) heart failure: Secondary | ICD-10-CM | POA: Diagnosis not present

## 2017-10-24 DIAGNOSIS — E559 Vitamin D deficiency, unspecified: Secondary | ICD-10-CM | POA: Diagnosis not present

## 2017-10-24 DIAGNOSIS — I482 Chronic atrial fibrillation: Secondary | ICD-10-CM | POA: Diagnosis not present

## 2017-11-09 DIAGNOSIS — I4891 Unspecified atrial fibrillation: Secondary | ICD-10-CM | POA: Diagnosis not present

## 2017-11-09 DIAGNOSIS — R0602 Shortness of breath: Secondary | ICD-10-CM | POA: Diagnosis not present

## 2017-11-09 DIAGNOSIS — R918 Other nonspecific abnormal finding of lung field: Secondary | ICD-10-CM | POA: Diagnosis not present

## 2017-11-09 DIAGNOSIS — R911 Solitary pulmonary nodule: Secondary | ICD-10-CM | POA: Diagnosis not present

## 2017-11-15 DIAGNOSIS — L570 Actinic keratosis: Secondary | ICD-10-CM | POA: Diagnosis not present

## 2017-11-15 DIAGNOSIS — D485 Neoplasm of uncertain behavior of skin: Secondary | ICD-10-CM | POA: Diagnosis not present

## 2017-11-15 DIAGNOSIS — C4442 Squamous cell carcinoma of skin of scalp and neck: Secondary | ICD-10-CM | POA: Diagnosis not present

## 2017-11-22 DIAGNOSIS — I482 Chronic atrial fibrillation: Secondary | ICD-10-CM | POA: Diagnosis not present

## 2017-12-01 DIAGNOSIS — H029 Unspecified disorder of eyelid: Secondary | ICD-10-CM | POA: Diagnosis not present

## 2017-12-01 DIAGNOSIS — H1045 Other chronic allergic conjunctivitis: Secondary | ICD-10-CM | POA: Diagnosis not present

## 2017-12-04 DIAGNOSIS — I442 Atrioventricular block, complete: Secondary | ICD-10-CM | POA: Diagnosis not present

## 2017-12-04 DIAGNOSIS — Z950 Presence of cardiac pacemaker: Secondary | ICD-10-CM | POA: Diagnosis not present

## 2017-12-12 DIAGNOSIS — M5126 Other intervertebral disc displacement, lumbar region: Secondary | ICD-10-CM | POA: Diagnosis not present

## 2017-12-12 DIAGNOSIS — M5416 Radiculopathy, lumbar region: Secondary | ICD-10-CM | POA: Diagnosis not present

## 2017-12-12 DIAGNOSIS — M48061 Spinal stenosis, lumbar region without neurogenic claudication: Secondary | ICD-10-CM | POA: Diagnosis not present

## 2017-12-12 DIAGNOSIS — M47816 Spondylosis without myelopathy or radiculopathy, lumbar region: Secondary | ICD-10-CM | POA: Diagnosis not present

## 2017-12-12 DIAGNOSIS — M5136 Other intervertebral disc degeneration, lumbar region: Secondary | ICD-10-CM | POA: Diagnosis not present

## 2017-12-12 DIAGNOSIS — M545 Low back pain: Secondary | ICD-10-CM | POA: Diagnosis not present

## 2017-12-12 DIAGNOSIS — R05 Cough: Secondary | ICD-10-CM | POA: Diagnosis not present

## 2017-12-26 DIAGNOSIS — M5441 Lumbago with sciatica, right side: Secondary | ICD-10-CM | POA: Diagnosis not present

## 2017-12-26 DIAGNOSIS — K219 Gastro-esophageal reflux disease without esophagitis: Secondary | ICD-10-CM | POA: Diagnosis not present

## 2017-12-26 DIAGNOSIS — R252 Cramp and spasm: Secondary | ICD-10-CM | POA: Diagnosis not present

## 2017-12-26 DIAGNOSIS — D510 Vitamin B12 deficiency anemia due to intrinsic factor deficiency: Secondary | ICD-10-CM | POA: Diagnosis not present

## 2017-12-26 DIAGNOSIS — Z6824 Body mass index (BMI) 24.0-24.9, adult: Secondary | ICD-10-CM | POA: Diagnosis not present

## 2017-12-26 DIAGNOSIS — I482 Chronic atrial fibrillation: Secondary | ICD-10-CM | POA: Diagnosis not present

## 2017-12-26 DIAGNOSIS — I5032 Chronic diastolic (congestive) heart failure: Secondary | ICD-10-CM | POA: Diagnosis not present

## 2018-01-11 DIAGNOSIS — T148XXA Other injury of unspecified body region, initial encounter: Secondary | ICD-10-CM | POA: Diagnosis not present

## 2018-01-11 DIAGNOSIS — I5032 Chronic diastolic (congestive) heart failure: Secondary | ICD-10-CM | POA: Diagnosis not present

## 2018-01-11 DIAGNOSIS — I482 Chronic atrial fibrillation: Secondary | ICD-10-CM | POA: Diagnosis not present

## 2018-01-11 DIAGNOSIS — L03116 Cellulitis of left lower limb: Secondary | ICD-10-CM | POA: Diagnosis not present

## 2018-01-17 DIAGNOSIS — C4442 Squamous cell carcinoma of skin of scalp and neck: Secondary | ICD-10-CM | POA: Diagnosis not present

## 2018-01-26 ENCOUNTER — Emergency Department (HOSPITAL_BASED_OUTPATIENT_CLINIC_OR_DEPARTMENT_OTHER): Payer: Medicare Other

## 2018-01-26 ENCOUNTER — Emergency Department (HOSPITAL_COMMUNITY)
Admission: EM | Admit: 2018-01-26 | Discharge: 2018-01-26 | Disposition: A | Discharge status: Home / Self Care | Payer: Medicare Other | Attending: Emergency Medicine | Admitting: Emergency Medicine

## 2018-01-26 ENCOUNTER — Emergency Department (HOSPITAL_COMMUNITY): Payer: Medicare Other

## 2018-01-26 ENCOUNTER — Encounter (HOSPITAL_COMMUNITY): Payer: Self-pay

## 2018-01-26 ENCOUNTER — Other Ambulatory Visit: Payer: Self-pay

## 2018-01-26 DIAGNOSIS — L03114 Cellulitis of left upper limb: Secondary | ICD-10-CM | POA: Diagnosis not present

## 2018-01-26 DIAGNOSIS — Z91040 Latex allergy status: Secondary | ICD-10-CM | POA: Diagnosis not present

## 2018-01-26 DIAGNOSIS — M79609 Pain in unspecified limb: Secondary | ICD-10-CM

## 2018-01-26 DIAGNOSIS — R609 Edema, unspecified: Secondary | ICD-10-CM

## 2018-01-26 DIAGNOSIS — N200 Calculus of kidney: Secondary | ICD-10-CM | POA: Diagnosis not present

## 2018-01-26 DIAGNOSIS — L03116 Cellulitis of left lower limb: Secondary | ICD-10-CM | POA: Insufficient documentation

## 2018-01-26 DIAGNOSIS — Z87442 Personal history of urinary calculi: Secondary | ICD-10-CM | POA: Diagnosis not present

## 2018-01-26 DIAGNOSIS — S81802A Unspecified open wound, left lower leg, initial encounter: Secondary | ICD-10-CM | POA: Diagnosis not present

## 2018-01-26 DIAGNOSIS — M7989 Other specified soft tissue disorders: Secondary | ICD-10-CM | POA: Diagnosis not present

## 2018-01-26 DIAGNOSIS — M79605 Pain in left leg: Secondary | ICD-10-CM | POA: Diagnosis present

## 2018-01-26 DIAGNOSIS — Z79899 Other long term (current) drug therapy: Secondary | ICD-10-CM | POA: Insufficient documentation

## 2018-01-26 DIAGNOSIS — Z7901 Long term (current) use of anticoagulants: Secondary | ICD-10-CM | POA: Diagnosis not present

## 2018-01-26 DIAGNOSIS — R109 Unspecified abdominal pain: Secondary | ICD-10-CM | POA: Insufficient documentation

## 2018-01-26 HISTORY — DX: Disorder of kidney and ureter, unspecified: N28.9

## 2018-01-26 LAB — URINALYSIS, ROUTINE W REFLEX MICROSCOPIC
BILIRUBIN URINE: NEGATIVE
Bacteria, UA: NONE SEEN
GLUCOSE, UA: 150 mg/dL — AB
Ketones, ur: NEGATIVE mg/dL
Leukocytes, UA: NEGATIVE
NITRITE: NEGATIVE
Protein, ur: NEGATIVE mg/dL
RBC / HPF: >50 RBC/hpf — ABNORMAL HIGH (ref 0–5)
SPECIFIC GRAVITY, URINE: 1.017 (ref 1.005–1.030)
pH: 6 (ref 5.0–8.0)

## 2018-01-26 LAB — CBC
HEMATOCRIT: 37.3 % (ref 36.0–46.0)
Hemoglobin: 11.3 g/dL — ABNORMAL LOW (ref 12.0–15.0)
MCH: 27.3 pg (ref 26.0–34.0)
MCHC: 30.3 g/dL (ref 30.0–36.0)
MCV: 90.1 fL (ref 78.0–100.0)
PLATELETS: 183 10*3/uL (ref 150–400)
RBC: 4.14 MIL/uL (ref 3.87–5.11)
RDW: 14.4 % (ref 11.5–15.5)
WBC: 5.8 10*3/uL (ref 4.0–10.5)

## 2018-01-26 LAB — I-STAT CG4 LACTIC ACID, ED
LACTIC ACID, VENOUS: 2.49 mmol/L — AB (ref 0.5–1.9)
Lactic Acid, Venous: 2.53 mmol/L (ref 0.5–1.9)

## 2018-01-26 LAB — COMPREHENSIVE METABOLIC PANEL
ALK PHOS: 124 U/L (ref 38–126)
ALT: 19 U/L (ref 0–44)
AST: 24 U/L (ref 15–41)
Albumin: 3.3 g/dL — ABNORMAL LOW (ref 3.5–5.0)
Anion gap: 5 (ref 5–15)
BILIRUBIN TOTAL: 0.8 mg/dL (ref 0.3–1.2)
BUN: 13 mg/dL (ref 8–23)
CALCIUM: 8.8 mg/dL — AB (ref 8.9–10.3)
CO2: 23 mmol/L (ref 22–32)
CREATININE: 0.84 mg/dL (ref 0.44–1.00)
Chloride: 112 mmol/L — ABNORMAL HIGH (ref 98–111)
GFR calc Af Amer: >60 mL/min (ref >60)
Glucose, Bld: 82 mg/dL (ref 70–99)
Potassium: 4.2 mmol/L (ref 3.5–5.1)
Sodium: 140 mmol/L (ref 135–145)
TOTAL PROTEIN: 6.1 g/dL — AB (ref 6.5–8.1)

## 2018-01-26 MED ORDER — SULFAMETHOXAZOLE-TRIMETHOPRIM 200-40 MG/5ML PO SUSP: 20.0000 mL | Freq: Two times a day (BID) | ORAL | 0 refills | Status: AC

## 2018-01-26 MED ORDER — SODIUM CHLORIDE 0.9 % IV BOLUS
1000.0000 mL | Freq: Once | INTRAVENOUS | Status: AC
  Administered 2018-01-26: 1000 mL via INTRAVENOUS

## 2018-01-26 MED ORDER — SULFAMETHOXAZOLE-TRIMETHOPRIM 200-40 MG/5ML PO SUSP
20.0000 mL | Freq: Once | ORAL | Status: AC
  Administered 2018-01-26: 20 mL via ORAL
  Filled 2018-01-26: qty 20

## 2018-01-26 NOTE — ED Notes (Signed)
Pt alert and oriented in NAD. Pt verbalized understanding of discharge instructions. 

## 2018-01-26 NOTE — ED Notes (Signed)
Patient transported to vascular Ultrasound

## 2018-01-26 NOTE — Discharge Instructions (Addendum)
As discussed, your evaluation today has been largely reassuring.  But, it is important that you monitor your condition carefully, and do not hesitate to return to the ED if you develop new, or concerning changes in your condition.  Otherwise, please follow-up with your physician for appropriate ongoing care.  Be sure to have your wound evaluated. It is also important that you have your INR level checked when you see your physician next week.

## 2018-01-26 NOTE — ED Notes (Signed)
IV team unable to get access at this time.

## 2018-01-26 NOTE — ED Notes (Signed)
Cleared for [place] by Dr. Vanita Panda.

## 2018-01-26 NOTE — ED Notes (Signed)
IV team remains at bedside 

## 2018-01-26 NOTE — Progress Notes (Signed)
Preliminary notes--Left lower extremities venous duplex exam completed. Positive for age indeterminate Deep vein and Superficial vein thrombosis involving peroneal veins and great saphenous vein.  Robin Bennett (RDMS RVT) 01/26/18 3:04 PM

## 2018-01-26 NOTE — ED Provider Notes (Signed)
[facility] Provider Note   CSN: [id number] Arrival date & time: 01/26/18  1153     History   Chief Complaint Chief Complaint  Patient presents with  . Cellulitis  . Flank Pain    HPI Robin Bennett is a 84 y.o. female.  HPI Resents with multiple complaints. She has a history of kidney stones, and recurrent nonhealing wounds in her lower extremities. She now presents with concern of both left flank pain and a erythematous painful left lower leg. She notes the leg became an issue about 2 weeks ago, and she has completed a course of penicillin, but she has persistent pain, full sensation in the mid anterior calf. No new fever, though she notes that she does not typically get fever with infections. She has declined taking pain medicine but occasionally takes Aleve for transient relief of her leg pain which is sore. Over the past few days she also developed left flank pain, malodorous urine.  She has a history of kidney stone, notes that this is somewhat similar, though not the same. She recently transferred her domicile from [place] to this area with family.   Past Medical History:  Diagnosis Date  . Renal disorder    Kidney stone    There are no active problems to display for this patient.   Past Surgical History:  Procedure Laterality Date  . BACK SURGERY    . BREAST LUMPECTOMY    . GASTRIC BYPASS       OB History   None      Home Medications    Prior to Admission medications   Medication Sig Start Date End Date Taking? Authorizing Provider  cyclobenzaprine (FLEXERIL) 5 MG tablet Take 5 mg by mouth at bedtime as needed for muscle spasms.   Yes [provider]  furosemide (LASIX) 20 MG tablet Take 20 mg by mouth 2 (two) times daily.   Yes [provider]  gabapentin (NEURONTIN) 100 MG capsule Take 100 mg by mouth 2 (two) times daily as needed (pain).   Yes [provider]  Lidocaine 4 % PTCH  Apply 1 patch topically daily as needed (pain).   Yes [provider]  naproxen sodium (ALEVE) 220 MG tablet Take 220 mg by mouth as needed (pain).   Yes [provider]  sucralfate (CARAFATE) 1 GM/10ML suspension Take 1 g by mouth every morning. On a empty stomach   Yes [provider]  warfarin (COUMADIN) 3 MG tablet Take 3 mg by mouth one time only at 6 PM.   Yes [provider]  sulfamethoxazole-trimethoprim (BACTRIM,SEPTRA) 200-40 MG/5ML suspension Take 20 mLs by mouth 2 (two) times daily for 5 days. 01/26/18 01/31/18  Carmin Muskrat, MD    Family History History reviewed. No pertinent family history.  Social History Social History   Tobacco Use  . Smoking status: Never Smoker  . Smokeless tobacco: Never Used  Substance Use Topics  . Alcohol use: Not Currently  . Drug use: Not Currently     Allergies   Codeine; Latex; and Morphine and related   Review of Systems Review of Systems  Constitutional:       Per HPI, otherwise negative  HENT:       Per HPI, otherwise negative  Respiratory:       Per HPI, otherwise negative  Cardiovascular:       Per HPI, otherwise negative  Gastrointestinal: Negative for vomiting.  Endocrine:  Negative aside from HPI  Genitourinary:       Neg aside from HPI   Musculoskeletal:       Per HPI, otherwise negative  Skin: Positive for wound.  Neurological: Negative for syncope.     Physical Exam Updated Vital Signs BP 129/75   Pulse 78   Temp (!) 97.4 F (36.3 C) (Oral)   Resp 18   SpO2 98%   Physical Exam  Constitutional: She is oriented to person, place, and time. She has a sickly appearance. No distress.  HENT:  Head: Normocephalic and atraumatic.  Eyes: Conjunctivae and EOM are normal.  Cardiovascular: Normal rate and regular rhythm.  Pulmonary/Chest: Effort normal and breath sounds normal. No stridor. No respiratory distress.  Abdominal: She exhibits no distension.  No TTP    Musculoskeletal: She exhibits no edema.  Neurological: She is alert and oriented to person, place, and time. No cranial nerve deficit.  Skin: Skin is warm and dry.     Psychiatric: She has a normal mood and affect.  Nursing note and vitals reviewed.    ED Treatments / Results  Labs (all labs ordered are listed, but only abnormal results are displayed) Labs Reviewed  URINALYSIS, ROUTINE W REFLEX MICROSCOPIC - Abnormal; Notable for the following components:      Result Value   APPearance HAZY (*)    Glucose, UA 150 (*)    Hgb urine dipstick LARGE (*)    RBC / HPF >50 (*)    All other components within normal limits  CBC - Abnormal; Notable for the following components:   Hemoglobin 11.3 (*)    All other components within normal limits  COMPREHENSIVE METABOLIC PANEL - Abnormal; Notable for the following components:   Chloride 112 (*)    Calcium 8.8 (*)    Total Protein 6.1 (*)    Albumin 3.3 (*)    All other components within normal limits  I-STAT CG4 LACTIC ACID, ED - Abnormal; Notable for the following components:   Lactic Acid, Venous 2.53 (*)    All other components within normal limits  I-STAT CG4 LACTIC ACID, ED - Abnormal; Notable for the following components:   Lactic Acid, Venous 2.49 (*)    All other components within normal limits  I-STAT CG4 LACTIC ACID, ED  I-STAT CG4 LACTIC ACID, ED    EKG None  Radiology Dg Tibia/fibula Left  Result Date: 01/26/2018 CLINICAL DATA:  Left lower leg wound. EXAM: LEFT TIBIA AND FIBULA - 2 VIEW COMPARISON:  No prior. FINDINGS: Postsurgical changes distal femur. Diffuse osteopenia. Degenerative changes left knee and left ankle. Small corticated bony densities noted adjacent to the medial malleolus consistent old avulsion fractures. No acute abnormality identified. Peripheral vascular calcification. IMPRESSION: 1.  Postsurgical changes left femur. 2. Diffuse osteopenia and degenerative change. Small corticated bony densities noted  adjacent to the medial malleolus consistent with old avulsion fractures. No acute bony abnormality. 3.  Peripheral vascular disease. Electronically Signed   By: Marcello Moores  Register   On: 01/26/2018 13:19   Ct Renal Stone Study  Result Date: 01/26/2018 CLINICAL DATA:  Left-sided flank pain EXAM: CT ABDOMEN AND PELVIS WITHOUT CONTRAST TECHNIQUE: Multidetector CT imaging of the abdomen and pelvis was performed following the standard protocol without IV contrast. COMPARISON:  None. FINDINGS: Lower chest: No acute consolidation or pleural effusion. 2 mm right middle lobe pulmonary nodule, series 5, image number 6. Heart size within normal limits. Aortic valve replacement. Partially visualized cardiac pacing leads. Hepatobiliary: No focal  liver abnormality is seen. Status post cholecystectomy. No biliary dilatation. Pancreas: Unremarkable. No pancreatic ductal dilatation or surrounding inflammatory changes. Spleen: Normal in size without focal abnormality. Adrenals/Urinary Tract: Adrenal glands are within normal limits. No hydronephrosis. Small nonobstructing stones within both kidneys, measuring up to 3 mm in size on the right and 4 mm in size on the left. The bladder is unremarkable. Stomach/Bowel: Status post gastric bypass surgery. No evidence for an obstruction. No colon wall thickening. Appendix not well seen but no right lower quadrant inflammatory process Vascular/Lymphatic: Moderate aortic atherosclerosis. No aneurysmal dilatation. No grossly enlarged lymph nodes. Reproductive: Status post hysterectomy. No adnexal masses. Other: Negative for free air or free fluid. Musculoskeletal: Left femoral rod with artifact. Scoliosis and degenerative changes of the spine. No acute or suspicious abnormality IMPRESSION: 1. Intrarenal calculi. No evidence for hydronephrosis or ureteral stone. 2. 2 mm right middle lobe pulmonary nodule. No follow-up needed if patient is low-risk. Non-contrast chest CT can be considered in 12  months if patient is high-risk. This recommendation follows the consensus statement: Guidelines for Management of Incidental Pulmonary Nodules Detected on CT Images: From the Fleischner Society 2017; Radiology 2017; 284:228-243. 3. Status post gastric bypass surgery. No evidence for bowel obstruction. Electronically Signed   By: Donavan Foil M.D.   On: 01/26/2018 19:22    Procedures Procedures (including critical care time)  Medications Ordered in ED Medications  sodium chloride 0.9 % bolus 1,000 mL (0 mLs Intravenous Stopped 01/26/18 1728)  sulfamethoxazole-trimethoprim (BACTRIM,SEPTRA) 200-40 MG/5ML suspension 20 mL (20 mLs Oral Given 01/26/18 2032)     Initial Impression / Assessment and Plan / ED Course  I have reviewed the triage vital signs and the nursing notes.  Pertinent labs & imaging results that were available during my care of the patient were reviewed by me and considered in my medical decision making (see chart for details).     Date:, Patient in no distress. We discussed all findings including slight abnormality on urinalysis, subsequent CT renal stone protocol, with no demonstration of ureteral stone, nor hydronephrosis, but with identification of intrarenal calculi. Labs generally reassuring, no leukocytosis. She does have mild lactic acidosis, but no distress, has improved here, has received fluids after the initial lactic acid values, and given this improvement, absence of evidence for bacteremia, sepsis, patient was discharged after initiation of antibiotics for her cellulitis per Patient has a prescheduled follow-up appointment in 1 week, was encouraged to keep that visit, or return here for concerning changes.  Final Clinical Impressions(s) / ED Diagnoses  Cellulitis of left lower extremity, initial encounter Flank pain  ED Discharge Orders         Ordered    sulfamethoxazole-trimethoprim (BACTRIM,SEPTRA) 200-40 MG/5ML suspension  2 times daily     01/26/18 1958            Carmin Muskrat, MD 01/26/18 2148

## 2018-01-26 NOTE — ED Notes (Signed)
Contacting pharmacy for medication

## 2018-01-26 NOTE — ED Notes (Addendum)
2 unsuccessful attempts. Iv consult ordered

## 2018-01-26 NOTE — ED Triage Notes (Signed)
Pt reports left leg cellulitis. She has redness to the left shin/calf area. Redness noted. She also has left flank pain, hx of kidney stones.

## 2018-02-02 DIAGNOSIS — L97921 Non-pressure chronic ulcer of unspecified part of left lower leg limited to breakdown of skin: Secondary | ICD-10-CM | POA: Diagnosis not present

## 2018-02-02 DIAGNOSIS — I482 Chronic atrial fibrillation: Secondary | ICD-10-CM | POA: Diagnosis not present

## 2018-02-02 DIAGNOSIS — Z5181 Encounter for therapeutic drug level monitoring: Secondary | ICD-10-CM | POA: Diagnosis not present

## 2018-02-02 DIAGNOSIS — Z23 Encounter for immunization: Secondary | ICD-10-CM | POA: Diagnosis not present

## 2018-02-02 DIAGNOSIS — Z952 Presence of prosthetic heart valve: Secondary | ICD-10-CM | POA: Diagnosis not present

## 2018-02-02 DIAGNOSIS — Z79899 Other long term (current) drug therapy: Secondary | ICD-10-CM | POA: Diagnosis not present

## 2018-02-02 DIAGNOSIS — Z7901 Long term (current) use of anticoagulants: Secondary | ICD-10-CM | POA: Diagnosis not present

## 2018-02-09 ENCOUNTER — Ambulatory Visit
Admission: RE | Admit: 2018-02-09 | Discharge: 2018-02-09 | Disposition: A | Discharge status: Home / Self Care | Payer: Medicare Other | Source: Ambulatory Visit | Attending: Family Medicine | Admitting: Family Medicine

## 2018-02-09 ENCOUNTER — Other Ambulatory Visit: Payer: Self-pay | Admitting: Family Medicine

## 2018-02-09 DIAGNOSIS — R609 Edema, unspecified: Secondary | ICD-10-CM | POA: Diagnosis not present

## 2018-02-09 DIAGNOSIS — W19XXXA Unspecified fall, initial encounter: Secondary | ICD-10-CM

## 2018-02-09 DIAGNOSIS — S20219A Contusion of unspecified front wall of thorax, initial encounter: Secondary | ICD-10-CM | POA: Diagnosis not present

## 2018-02-09 DIAGNOSIS — R0602 Shortness of breath: Secondary | ICD-10-CM | POA: Diagnosis not present

## 2018-02-09 DIAGNOSIS — R0781 Pleurodynia: Secondary | ICD-10-CM

## 2018-02-09 DIAGNOSIS — Z7901 Long term (current) use of anticoagulants: Secondary | ICD-10-CM | POA: Diagnosis not present

## 2018-02-09 DIAGNOSIS — E538 Deficiency of other specified B group vitamins: Secondary | ICD-10-CM | POA: Diagnosis not present

## 2018-02-09 DIAGNOSIS — I482 Chronic atrial fibrillation: Secondary | ICD-10-CM | POA: Diagnosis not present

## 2018-02-17 DIAGNOSIS — S81802D Unspecified open wound, left lower leg, subsequent encounter: Secondary | ICD-10-CM | POA: Diagnosis not present

## 2018-02-20 DIAGNOSIS — Z7901 Long term (current) use of anticoagulants: Secondary | ICD-10-CM | POA: Diagnosis not present

## 2018-02-24 DIAGNOSIS — I890 Lymphedema, not elsewhere classified: Secondary | ICD-10-CM | POA: Diagnosis not present

## 2018-02-24 DIAGNOSIS — T148XXA Other injury of unspecified body region, initial encounter: Secondary | ICD-10-CM | POA: Diagnosis not present

## 2018-02-24 DIAGNOSIS — T148XXD Other injury of unspecified body region, subsequent encounter: Secondary | ICD-10-CM | POA: Diagnosis not present

## 2018-02-24 DIAGNOSIS — L270 Generalized skin eruption due to drugs and medicaments taken internally: Secondary | ICD-10-CM | POA: Diagnosis not present

## 2018-03-06 ENCOUNTER — Ambulatory Visit (INDEPENDENT_AMBULATORY_CARE_PROVIDER_SITE_OTHER): Payer: Medicare Other | Admitting: Internal Medicine

## 2018-03-06 ENCOUNTER — Encounter: Payer: Self-pay | Admitting: Internal Medicine

## 2018-03-06 VITALS — BP 98/60 | HR 70 | Ht 61.0 in | Wt 126.8 lb

## 2018-03-06 DIAGNOSIS — I442 Atrioventricular block, complete: Secondary | ICD-10-CM | POA: Diagnosis not present

## 2018-03-06 DIAGNOSIS — Z95810 Presence of automatic (implantable) cardiac defibrillator: Secondary | ICD-10-CM | POA: Diagnosis not present

## 2018-03-06 DIAGNOSIS — I482 Chronic atrial fibrillation, unspecified: Secondary | ICD-10-CM

## 2018-03-06 DIAGNOSIS — Z7689 Persons encountering health services in other specified circumstances: Secondary | ICD-10-CM | POA: Diagnosis not present

## 2018-03-06 LAB — CUP PACEART INCLINIC DEVICE CHECK
Implantable Lead Implant Date: 20150320
Implantable Lead Location: 753860
Implantable Lead Model: 4136
Implantable Lead Serial Number: 29610206
Lead Channel Impedance Value: 3000 Ohm
Lead Channel Impedance Value: 858 Ohm
Lead Channel Pacing Threshold Pulse Width: 0.4 ms
Lead Channel Sensing Intrinsic Amplitude: 11 mV
Lead Channel Sensing Intrinsic Amplitude: 6 mV
Lead Channel Setting Pacing Amplitude: 2 V
Lead Channel Setting Pacing Amplitude: 2.3 V
Lead Channel Setting Pacing Pulse Width: 0.4 ms
MDC IDC LEAD IMPLANT DT: 20150320
MDC IDC LEAD LOCATION: 753858
MDC IDC MSMT LEADCHNL LV IMPEDANCE VALUE: 1651 Ohm
MDC IDC MSMT LEADCHNL LV PACING THRESHOLD AMPLITUDE: 1.2 V
MDC IDC MSMT LEADCHNL LV PACING THRESHOLD PULSEWIDTH: 0.5 ms
MDC IDC MSMT LEADCHNL RV PACING THRESHOLD AMPLITUDE: 0.7 V
MDC IDC PG IMPLANT DT: 20150320
MDC IDC PG SERIAL: 101298
MDC IDC SESS DTM: 20190930040000
MDC IDC SET LEADCHNL LV PACING PULSEWIDTH: 0.5 ms
MDC IDC SET LEADCHNL LV SENSING SENSITIVITY: 2.5 mV
MDC IDC SET LEADCHNL RV SENSING SENSITIVITY: 2.5 mV

## 2018-03-06 NOTE — Progress Notes (Signed)
HPI Robin Bennett is referred today to establish after moving to [place] to be closer to her grandaughter. She is an elderly woman with multiple medical problems including AS, s/p TAVR, atrial fib, uncontrolled s/p AV node ablation and biv ppm insertion, severe kyphosis, and DJD. She has class 2 CHF symptoms though she is fairly sedentary. She injured her lower leg moving when a box his her shin and has a draining area for which she has received anti-biotics. Allergies  Allergen Reactions  . Codeine     Do not work.   . Latex Hives  . Morphine And Related Other (See Comments)    Doesn't work for her pain     Current Outpatient Medications  Medication Sig Dispense Refill  . cyclobenzaprine (FLEXERIL) 5 MG tablet Take 5 mg by mouth at bedtime as needed for muscle spasms.    . furosemide (LASIX) 20 MG tablet Take 20 mg by mouth 2 (two) times daily.    Marland Kitchen gabapentin (NEURONTIN) 100 MG capsule Take 100 mg by mouth 2 (two) times daily as needed (pain).    . Lidocaine 4 % PTCH Apply 1 patch topically daily as needed (pain).    . naproxen sodium (ALEVE) 220 MG tablet Take 220 mg by mouth as needed (pain).    . sucralfate (CARAFATE) 1 GM/10ML suspension Take 1 g by mouth every morning. On a empty stomach    . warfarin (COUMADIN) 3 MG tablet Take 3 mg by mouth one time only at 6 PM.     No current facility-administered medications for this visit.      Past Medical History:  Diagnosis Date  . Renal disorder    Kidney stone    ROS:   All systems reviewed and negative except as noted in the HPI.   Past Surgical History:  Procedure Laterality Date  . BACK SURGERY    . BREAST LUMPECTOMY    . GASTRIC BYPASS       Family History  Problem Relation Age of Onset  . Heart attack Father      Social History   Socioeconomic History  . Marital status: Widowed    Spouse name: Not on file  . Number of children: Not on file  . Years of education: Not on file  . Highest education  level: Not on file  Occupational History  . Not on file  Social Needs  . Financial resource strain: Not on file  . Food insecurity:    Worry: Not on file    Inability: Not on file  . Transportation needs:    Medical: Not on file    Non-medical: Not on file  Tobacco Use  . Smoking status: Never Smoker  . Smokeless tobacco: Never Used  Substance and Sexual Activity  . Alcohol use: Not Currently  . Drug use: Not Currently  . Sexual activity: Not on file  Lifestyle  . Physical activity:    Days per week: Not on file    Minutes per session: Not on file  . Stress: Not on file  Relationships  . Social connections:    Talks on phone: Not on file    Gets together: Not on file    Attends religious service: Not on file    Active member of club or organization: Not on file    Attends meetings of clubs or organizations: Not on file    Relationship status: Not on file  . Intimate partner violence:  Fear of current or ex partner: Not on file    Emotionally abused: Not on file    Physically abused: Not on file    Forced sexual activity: Not on file  Other Topics Concern  . Not on file  Social History Narrative  . Not on file     BP 98/60   Pulse 70   Ht 5\' 1"  (1.549 m)   Wt 126 lb 12.8 oz (57.5 kg)   BMI 23.96 kg/m   Physical Exam:  Chronically ill appearing NAD HEENT: Unremarkable Neck:  6 cm JVD, no thyromegally Lymphatics:  No adenopathy Back:  No CVA tenderness Lungs:  Clear with no wheezes HEART:  Regular rate rhythm, no murmurs, no rubs, no clicks Abd:  soft, positive bowel sounds, no organomegally, no rebound, no guarding Ext:  2 plus pulses, no edema, no cyanosis, no clubbing Skin:  No rashes no nodules Neuro:  CN II through XII intact, motor grossly intact  EKG - atrial fib with biv pacing  DEVICE  Normal device function.  See PaceArt for details.   Assess/Plan: 1. Atrial fib - her VR is well controlled. She will continue her current meds.  2. Chronic  systolic heart failure - her symptoms are class 2. She is limited by her mobility more than anything. 3. Venous insufficiency - she is currently undergoing evaluation. I have encouraged her to keep her legs elevated. 4. PPM - her [employer] PPM is working normally. She has 9 years of battery longevity.  Mikle Bosworth.D.

## 2018-03-06 NOTE — Patient Instructions (Addendum)
Your physician recommends that you continue on your current medications as directed. Please refer to the Current Medication list given to you today.  Remote monitoring is used to monitor your Pacemaker of ICD from home. This monitoring reduces the number of office visits required to check your device to one time per year. It allows [place] to keep an eye on the functioning of your device to ensure it is working properly. You are scheduled for a device check from home on 06/05/18. You may send your transmission at any time that day. If you have a wireless device, the transmission will be sent automatically. After your physician reviews your transmission, you will receive a postcard with your next transmission date.   Your physician wants you to follow-up in: 1 year with Dr. Lovena Le.  You will receive a reminder letter in the mail two months in advance. If you don't receive a letter, please call our office to schedule the follow-up appointment.

## 2018-03-07 DIAGNOSIS — L97921 Non-pressure chronic ulcer of unspecified part of left lower leg limited to breakdown of skin: Secondary | ICD-10-CM | POA: Diagnosis not present

## 2018-03-07 DIAGNOSIS — T148XXA Other injury of unspecified body region, initial encounter: Secondary | ICD-10-CM | POA: Diagnosis not present

## 2018-03-07 DIAGNOSIS — Z7901 Long term (current) use of anticoagulants: Secondary | ICD-10-CM | POA: Diagnosis not present

## 2018-03-10 DIAGNOSIS — Z7901 Long term (current) use of anticoagulants: Secondary | ICD-10-CM | POA: Diagnosis not present

## 2018-03-13 DIAGNOSIS — Z7901 Long term (current) use of anticoagulants: Secondary | ICD-10-CM | POA: Diagnosis not present

## 2018-03-16 ENCOUNTER — Other Ambulatory Visit (HOSPITAL_COMMUNITY): Payer: Self-pay | Admitting: Family Medicine

## 2018-03-16 ENCOUNTER — Ambulatory Visit (HOSPITAL_COMMUNITY)
Admission: RE | Admit: 2018-03-16 | Discharge: 2018-03-16 | Disposition: A | Discharge status: Home / Self Care | Payer: Medicare Other | Source: Ambulatory Visit | Attending: Family | Admitting: Family

## 2018-03-16 DIAGNOSIS — L97921 Non-pressure chronic ulcer of unspecified part of left lower leg limited to breakdown of skin: Secondary | ICD-10-CM | POA: Diagnosis not present

## 2018-03-16 DIAGNOSIS — L97221 Non-pressure chronic ulcer of left calf limited to breakdown of skin: Secondary | ICD-10-CM

## 2018-03-17 ENCOUNTER — Ambulatory Visit (HOSPITAL_COMMUNITY)
Admission: RE | Admit: 2018-03-17 | Discharge: 2018-03-17 | Disposition: A | Discharge status: Home / Self Care | Payer: Medicare Other | Source: Ambulatory Visit | Attending: Family | Admitting: Family

## 2018-03-17 DIAGNOSIS — L97221 Non-pressure chronic ulcer of left calf limited to breakdown of skin: Secondary | ICD-10-CM

## 2018-03-20 DIAGNOSIS — Z7901 Long term (current) use of anticoagulants: Secondary | ICD-10-CM | POA: Diagnosis not present

## 2018-03-21 ENCOUNTER — Encounter (HOSPITAL_BASED_OUTPATIENT_CLINIC_OR_DEPARTMENT_OTHER): Payer: Medicare Other | Attending: Internal Medicine

## 2018-03-21 DIAGNOSIS — Z950 Presence of cardiac pacemaker: Secondary | ICD-10-CM | POA: Insufficient documentation

## 2018-03-21 DIAGNOSIS — L97221 Non-pressure chronic ulcer of left calf limited to breakdown of skin: Secondary | ICD-10-CM | POA: Insufficient documentation

## 2018-03-21 DIAGNOSIS — S81802A Unspecified open wound, left lower leg, initial encounter: Secondary | ICD-10-CM | POA: Diagnosis not present

## 2018-03-21 DIAGNOSIS — I4430 Unspecified atrioventricular block: Secondary | ICD-10-CM | POA: Diagnosis not present

## 2018-03-21 DIAGNOSIS — Z952 Presence of prosthetic heart valve: Secondary | ICD-10-CM | POA: Insufficient documentation

## 2018-03-21 DIAGNOSIS — I87332 Chronic venous hypertension (idiopathic) with ulcer and inflammation of left lower extremity: Secondary | ICD-10-CM | POA: Diagnosis not present

## 2018-03-21 DIAGNOSIS — I739 Peripheral vascular disease, unspecified: Secondary | ICD-10-CM | POA: Insufficient documentation

## 2018-03-21 DIAGNOSIS — I872 Venous insufficiency (chronic) (peripheral): Secondary | ICD-10-CM | POA: Diagnosis not present

## 2018-03-21 DIAGNOSIS — Z7901 Long term (current) use of anticoagulants: Secondary | ICD-10-CM | POA: Insufficient documentation

## 2018-03-21 DIAGNOSIS — I4891 Unspecified atrial fibrillation: Secondary | ICD-10-CM | POA: Insufficient documentation

## 2018-03-21 DIAGNOSIS — Z9884 Bariatric surgery status: Secondary | ICD-10-CM | POA: Diagnosis not present

## 2018-03-27 DIAGNOSIS — Z7901 Long term (current) use of anticoagulants: Secondary | ICD-10-CM | POA: Diagnosis not present

## 2018-03-30 DIAGNOSIS — S81802A Unspecified open wound, left lower leg, initial encounter: Secondary | ICD-10-CM | POA: Diagnosis not present

## 2018-03-30 DIAGNOSIS — I4891 Unspecified atrial fibrillation: Secondary | ICD-10-CM | POA: Diagnosis not present

## 2018-03-30 DIAGNOSIS — I4430 Unspecified atrioventricular block: Secondary | ICD-10-CM | POA: Diagnosis not present

## 2018-03-30 DIAGNOSIS — L97221 Non-pressure chronic ulcer of left calf limited to breakdown of skin: Secondary | ICD-10-CM | POA: Diagnosis not present

## 2018-03-30 DIAGNOSIS — Z7901 Long term (current) use of anticoagulants: Secondary | ICD-10-CM | POA: Diagnosis not present

## 2018-03-30 DIAGNOSIS — I739 Peripheral vascular disease, unspecified: Secondary | ICD-10-CM | POA: Diagnosis not present

## 2018-03-30 DIAGNOSIS — I87332 Chronic venous hypertension (idiopathic) with ulcer and inflammation of left lower extremity: Secondary | ICD-10-CM | POA: Diagnosis not present

## 2018-04-05 DIAGNOSIS — I482 Chronic atrial fibrillation, unspecified: Secondary | ICD-10-CM | POA: Diagnosis not present

## 2018-04-05 DIAGNOSIS — Z7901 Long term (current) use of anticoagulants: Secondary | ICD-10-CM | POA: Diagnosis not present

## 2018-04-05 DIAGNOSIS — L97921 Non-pressure chronic ulcer of unspecified part of left lower leg limited to breakdown of skin: Secondary | ICD-10-CM | POA: Diagnosis not present

## 2018-04-06 DIAGNOSIS — I4891 Unspecified atrial fibrillation: Secondary | ICD-10-CM | POA: Diagnosis not present

## 2018-04-06 DIAGNOSIS — L97221 Non-pressure chronic ulcer of left calf limited to breakdown of skin: Secondary | ICD-10-CM | POA: Diagnosis not present

## 2018-04-06 DIAGNOSIS — I739 Peripheral vascular disease, unspecified: Secondary | ICD-10-CM | POA: Diagnosis not present

## 2018-04-06 DIAGNOSIS — Z7901 Long term (current) use of anticoagulants: Secondary | ICD-10-CM | POA: Diagnosis not present

## 2018-04-06 DIAGNOSIS — I87332 Chronic venous hypertension (idiopathic) with ulcer and inflammation of left lower extremity: Secondary | ICD-10-CM | POA: Diagnosis not present

## 2018-04-06 DIAGNOSIS — I4430 Unspecified atrioventricular block: Secondary | ICD-10-CM | POA: Diagnosis not present

## 2018-04-06 DIAGNOSIS — S81802A Unspecified open wound, left lower leg, initial encounter: Secondary | ICD-10-CM | POA: Diagnosis not present

## 2018-04-14 ENCOUNTER — Encounter (HOSPITAL_BASED_OUTPATIENT_CLINIC_OR_DEPARTMENT_OTHER): Payer: Medicare Other | Attending: Internal Medicine

## 2018-04-14 DIAGNOSIS — I4891 Unspecified atrial fibrillation: Secondary | ICD-10-CM | POA: Diagnosis not present

## 2018-04-14 DIAGNOSIS — L97221 Non-pressure chronic ulcer of left calf limited to breakdown of skin: Secondary | ICD-10-CM | POA: Diagnosis not present

## 2018-04-14 DIAGNOSIS — I87332 Chronic venous hypertension (idiopathic) with ulcer and inflammation of left lower extremity: Secondary | ICD-10-CM | POA: Diagnosis not present

## 2018-04-14 DIAGNOSIS — S81802A Unspecified open wound, left lower leg, initial encounter: Secondary | ICD-10-CM | POA: Diagnosis not present

## 2018-04-14 DIAGNOSIS — Z7901 Long term (current) use of anticoagulants: Secondary | ICD-10-CM | POA: Diagnosis not present

## 2018-04-14 DIAGNOSIS — E538 Deficiency of other specified B group vitamins: Secondary | ICD-10-CM | POA: Diagnosis not present

## 2018-04-17 DIAGNOSIS — E538 Deficiency of other specified B group vitamins: Secondary | ICD-10-CM | POA: Diagnosis not present

## 2018-04-21 DIAGNOSIS — L97221 Non-pressure chronic ulcer of left calf limited to breakdown of skin: Secondary | ICD-10-CM | POA: Diagnosis not present

## 2018-04-21 DIAGNOSIS — I87332 Chronic venous hypertension (idiopathic) with ulcer and inflammation of left lower extremity: Secondary | ICD-10-CM | POA: Diagnosis not present

## 2018-04-21 DIAGNOSIS — I4891 Unspecified atrial fibrillation: Secondary | ICD-10-CM | POA: Diagnosis not present

## 2018-04-21 DIAGNOSIS — S81802A Unspecified open wound, left lower leg, initial encounter: Secondary | ICD-10-CM | POA: Diagnosis not present

## 2018-04-24 DIAGNOSIS — Z7901 Long term (current) use of anticoagulants: Secondary | ICD-10-CM | POA: Diagnosis not present

## 2018-04-25 DIAGNOSIS — C44629 Squamous cell carcinoma of skin of left upper limb, including shoulder: Secondary | ICD-10-CM | POA: Diagnosis not present

## 2018-04-25 DIAGNOSIS — D485 Neoplasm of uncertain behavior of skin: Secondary | ICD-10-CM | POA: Diagnosis not present

## 2018-04-26 DIAGNOSIS — I87332 Chronic venous hypertension (idiopathic) with ulcer and inflammation of left lower extremity: Secondary | ICD-10-CM | POA: Diagnosis not present

## 2018-04-26 DIAGNOSIS — L97221 Non-pressure chronic ulcer of left calf limited to breakdown of skin: Secondary | ICD-10-CM | POA: Diagnosis not present

## 2018-04-26 DIAGNOSIS — I4891 Unspecified atrial fibrillation: Secondary | ICD-10-CM | POA: Diagnosis not present

## 2018-05-02 DIAGNOSIS — S81802A Unspecified open wound, left lower leg, initial encounter: Secondary | ICD-10-CM | POA: Diagnosis not present

## 2018-05-02 DIAGNOSIS — I4891 Unspecified atrial fibrillation: Secondary | ICD-10-CM | POA: Diagnosis not present

## 2018-05-02 DIAGNOSIS — L97221 Non-pressure chronic ulcer of left calf limited to breakdown of skin: Secondary | ICD-10-CM | POA: Diagnosis not present

## 2018-05-02 DIAGNOSIS — I87332 Chronic venous hypertension (idiopathic) with ulcer and inflammation of left lower extremity: Secondary | ICD-10-CM | POA: Diagnosis not present

## 2018-05-10 ENCOUNTER — Encounter (HOSPITAL_BASED_OUTPATIENT_CLINIC_OR_DEPARTMENT_OTHER): Payer: Medicare Other | Attending: Internal Medicine

## 2018-05-10 DIAGNOSIS — Z9884 Bariatric surgery status: Secondary | ICD-10-CM | POA: Diagnosis not present

## 2018-05-10 DIAGNOSIS — I87332 Chronic venous hypertension (idiopathic) with ulcer and inflammation of left lower extremity: Secondary | ICD-10-CM | POA: Diagnosis not present

## 2018-05-10 DIAGNOSIS — L97222 Non-pressure chronic ulcer of left calf with fat layer exposed: Secondary | ICD-10-CM | POA: Diagnosis not present

## 2018-05-10 DIAGNOSIS — Z952 Presence of prosthetic heart valve: Secondary | ICD-10-CM | POA: Insufficient documentation

## 2018-05-10 DIAGNOSIS — Z950 Presence of cardiac pacemaker: Secondary | ICD-10-CM | POA: Insufficient documentation

## 2018-05-10 DIAGNOSIS — Z7901 Long term (current) use of anticoagulants: Secondary | ICD-10-CM | POA: Diagnosis not present

## 2018-05-17 ENCOUNTER — Encounter (HOSPITAL_BASED_OUTPATIENT_CLINIC_OR_DEPARTMENT_OTHER): Payer: Medicare Other | Attending: Physician Assistant

## 2018-05-17 DIAGNOSIS — Z952 Presence of prosthetic heart valve: Secondary | ICD-10-CM | POA: Diagnosis not present

## 2018-05-17 DIAGNOSIS — S81802A Unspecified open wound, left lower leg, initial encounter: Secondary | ICD-10-CM | POA: Diagnosis not present

## 2018-05-17 DIAGNOSIS — I87332 Chronic venous hypertension (idiopathic) with ulcer and inflammation of left lower extremity: Secondary | ICD-10-CM | POA: Diagnosis not present

## 2018-05-17 DIAGNOSIS — Z7901 Long term (current) use of anticoagulants: Secondary | ICD-10-CM | POA: Diagnosis not present

## 2018-05-17 DIAGNOSIS — L97222 Non-pressure chronic ulcer of left calf with fat layer exposed: Secondary | ICD-10-CM | POA: Diagnosis not present

## 2018-05-17 DIAGNOSIS — Z9884 Bariatric surgery status: Secondary | ICD-10-CM | POA: Diagnosis not present

## 2018-05-17 DIAGNOSIS — E538 Deficiency of other specified B group vitamins: Secondary | ICD-10-CM | POA: Diagnosis not present

## 2018-05-17 DIAGNOSIS — Z950 Presence of cardiac pacemaker: Secondary | ICD-10-CM | POA: Diagnosis not present

## 2018-05-23 DIAGNOSIS — Z950 Presence of cardiac pacemaker: Secondary | ICD-10-CM | POA: Diagnosis not present

## 2018-05-23 DIAGNOSIS — Z7901 Long term (current) use of anticoagulants: Secondary | ICD-10-CM | POA: Diagnosis not present

## 2018-05-23 DIAGNOSIS — Z952 Presence of prosthetic heart valve: Secondary | ICD-10-CM | POA: Diagnosis not present

## 2018-05-23 DIAGNOSIS — L97222 Non-pressure chronic ulcer of left calf with fat layer exposed: Secondary | ICD-10-CM | POA: Diagnosis not present

## 2018-05-23 DIAGNOSIS — I87332 Chronic venous hypertension (idiopathic) with ulcer and inflammation of left lower extremity: Secondary | ICD-10-CM | POA: Diagnosis not present

## 2018-05-23 DIAGNOSIS — Z9884 Bariatric surgery status: Secondary | ICD-10-CM | POA: Diagnosis not present

## 2018-05-24 DIAGNOSIS — L570 Actinic keratosis: Secondary | ICD-10-CM | POA: Diagnosis not present

## 2018-05-24 DIAGNOSIS — C44629 Squamous cell carcinoma of skin of left upper limb, including shoulder: Secondary | ICD-10-CM | POA: Diagnosis not present

## 2018-05-29 DIAGNOSIS — Z952 Presence of prosthetic heart valve: Secondary | ICD-10-CM | POA: Diagnosis not present

## 2018-05-29 DIAGNOSIS — Z9884 Bariatric surgery status: Secondary | ICD-10-CM | POA: Diagnosis not present

## 2018-05-29 DIAGNOSIS — Z950 Presence of cardiac pacemaker: Secondary | ICD-10-CM | POA: Diagnosis not present

## 2018-05-29 DIAGNOSIS — L97222 Non-pressure chronic ulcer of left calf with fat layer exposed: Secondary | ICD-10-CM | POA: Diagnosis not present

## 2018-05-29 DIAGNOSIS — I87332 Chronic venous hypertension (idiopathic) with ulcer and inflammation of left lower extremity: Secondary | ICD-10-CM | POA: Diagnosis not present

## 2018-05-29 DIAGNOSIS — S81802A Unspecified open wound, left lower leg, initial encounter: Secondary | ICD-10-CM | POA: Diagnosis not present

## 2018-05-30 DIAGNOSIS — Z7901 Long term (current) use of anticoagulants: Secondary | ICD-10-CM | POA: Diagnosis not present

## 2018-06-05 ENCOUNTER — Telehealth: Payer: Self-pay

## 2018-06-05 ENCOUNTER — Ambulatory Visit (INDEPENDENT_AMBULATORY_CARE_PROVIDER_SITE_OTHER): Payer: Medicare Other

## 2018-06-05 DIAGNOSIS — I442 Atrioventricular block, complete: Secondary | ICD-10-CM

## 2018-06-05 DIAGNOSIS — I482 Chronic atrial fibrillation, unspecified: Secondary | ICD-10-CM

## 2018-06-05 NOTE — Telephone Encounter (Signed)
Spoke with patient to remind of missed remote transmission 

## 2018-06-06 DIAGNOSIS — Z952 Presence of prosthetic heart valve: Secondary | ICD-10-CM | POA: Diagnosis not present

## 2018-06-06 DIAGNOSIS — I87332 Chronic venous hypertension (idiopathic) with ulcer and inflammation of left lower extremity: Secondary | ICD-10-CM | POA: Diagnosis not present

## 2018-06-06 DIAGNOSIS — Z950 Presence of cardiac pacemaker: Secondary | ICD-10-CM | POA: Diagnosis not present

## 2018-06-06 DIAGNOSIS — S81802A Unspecified open wound, left lower leg, initial encounter: Secondary | ICD-10-CM | POA: Diagnosis not present

## 2018-06-06 DIAGNOSIS — L97222 Non-pressure chronic ulcer of left calf with fat layer exposed: Secondary | ICD-10-CM | POA: Diagnosis not present

## 2018-06-06 DIAGNOSIS — Z9884 Bariatric surgery status: Secondary | ICD-10-CM | POA: Diagnosis not present

## 2018-06-06 DIAGNOSIS — Z7901 Long term (current) use of anticoagulants: Secondary | ICD-10-CM | POA: Diagnosis not present

## 2018-06-06 LAB — CUP PACEART REMOTE DEVICE CHECK
Implantable Lead Implant Date: 20150320
Implantable Lead Location: 753858
Implantable Lead Model: 4136
Implantable Lead Serial Number: 29610206
Implantable Pulse Generator Implant Date: 20150320
MDC IDC LEAD IMPLANT DT: 20150320
MDC IDC LEAD LOCATION: 753860
MDC IDC PG SERIAL: 101298
MDC IDC SESS DTM: 20191231131036

## 2018-06-06 NOTE — Progress Notes (Signed)
Remote pacemaker transmission.   

## 2018-06-19 ENCOUNTER — Encounter (HOSPITAL_BASED_OUTPATIENT_CLINIC_OR_DEPARTMENT_OTHER): Payer: Medicare Other | Attending: Internal Medicine

## 2018-06-19 DIAGNOSIS — I509 Heart failure, unspecified: Secondary | ICD-10-CM | POA: Diagnosis not present

## 2018-06-19 DIAGNOSIS — S81802A Unspecified open wound, left lower leg, initial encounter: Secondary | ICD-10-CM | POA: Diagnosis not present

## 2018-06-19 DIAGNOSIS — L97222 Non-pressure chronic ulcer of left calf with fat layer exposed: Secondary | ICD-10-CM | POA: Insufficient documentation

## 2018-06-19 DIAGNOSIS — I87332 Chronic venous hypertension (idiopathic) with ulcer and inflammation of left lower extremity: Secondary | ICD-10-CM | POA: Insufficient documentation

## 2018-06-19 DIAGNOSIS — Z7901 Long term (current) use of anticoagulants: Secondary | ICD-10-CM | POA: Diagnosis not present

## 2018-06-26 DIAGNOSIS — I872 Venous insufficiency (chronic) (peripheral): Secondary | ICD-10-CM | POA: Diagnosis not present

## 2018-06-26 DIAGNOSIS — I509 Heart failure, unspecified: Secondary | ICD-10-CM | POA: Diagnosis not present

## 2018-06-26 DIAGNOSIS — L97222 Non-pressure chronic ulcer of left calf with fat layer exposed: Secondary | ICD-10-CM | POA: Diagnosis not present

## 2018-06-26 DIAGNOSIS — I87332 Chronic venous hypertension (idiopathic) with ulcer and inflammation of left lower extremity: Secondary | ICD-10-CM | POA: Diagnosis not present

## 2018-06-26 DIAGNOSIS — S81802A Unspecified open wound, left lower leg, initial encounter: Secondary | ICD-10-CM | POA: Diagnosis not present

## 2018-07-03 DIAGNOSIS — Z7901 Long term (current) use of anticoagulants: Secondary | ICD-10-CM | POA: Diagnosis not present

## 2018-07-17 DIAGNOSIS — Z7901 Long term (current) use of anticoagulants: Secondary | ICD-10-CM | POA: Diagnosis not present

## 2018-07-23 DIAGNOSIS — R05 Cough: Secondary | ICD-10-CM | POA: Diagnosis not present

## 2018-07-23 DIAGNOSIS — J101 Influenza due to other identified influenza virus with other respiratory manifestations: Secondary | ICD-10-CM | POA: Diagnosis not present

## 2018-07-23 DIAGNOSIS — R509 Fever, unspecified: Secondary | ICD-10-CM | POA: Diagnosis not present

## 2018-07-24 DIAGNOSIS — Z7901 Long term (current) use of anticoagulants: Secondary | ICD-10-CM | POA: Diagnosis not present

## 2018-08-01 DIAGNOSIS — D485 Neoplasm of uncertain behavior of skin: Secondary | ICD-10-CM | POA: Diagnosis not present

## 2018-08-01 DIAGNOSIS — C4442 Squamous cell carcinoma of skin of scalp and neck: Secondary | ICD-10-CM | POA: Diagnosis not present

## 2018-08-01 DIAGNOSIS — L821 Other seborrheic keratosis: Secondary | ICD-10-CM | POA: Diagnosis not present

## 2018-08-01 DIAGNOSIS — D225 Melanocytic nevi of trunk: Secondary | ICD-10-CM | POA: Diagnosis not present

## 2018-08-01 DIAGNOSIS — L578 Other skin changes due to chronic exposure to nonionizing radiation: Secondary | ICD-10-CM | POA: Diagnosis not present

## 2018-08-01 DIAGNOSIS — L570 Actinic keratosis: Secondary | ICD-10-CM | POA: Diagnosis not present

## 2018-08-01 DIAGNOSIS — Z85828 Personal history of other malignant neoplasm of skin: Secondary | ICD-10-CM | POA: Diagnosis not present

## 2018-08-07 DIAGNOSIS — Z7901 Long term (current) use of anticoagulants: Secondary | ICD-10-CM | POA: Diagnosis not present

## 2018-08-21 DIAGNOSIS — Z7901 Long term (current) use of anticoagulants: Secondary | ICD-10-CM | POA: Diagnosis not present

## 2018-08-26 ENCOUNTER — Encounter (HOSPITAL_COMMUNITY): Payer: Self-pay | Admitting: Emergency Medicine

## 2018-08-26 ENCOUNTER — Emergency Department (HOSPITAL_COMMUNITY)
Admission: EM | Admit: 2018-08-26 | Discharge: 2018-08-27 | Disposition: A | Discharge status: Home / Self Care | Payer: Medicare Other | Attending: Emergency Medicine | Admitting: Emergency Medicine

## 2018-08-26 ENCOUNTER — Other Ambulatory Visit: Payer: Self-pay

## 2018-08-26 DIAGNOSIS — Z7901 Long term (current) use of anticoagulants: Secondary | ICD-10-CM | POA: Diagnosis not present

## 2018-08-26 DIAGNOSIS — N200 Calculus of kidney: Secondary | ICD-10-CM | POA: Diagnosis not present

## 2018-08-26 DIAGNOSIS — R319 Hematuria, unspecified: Secondary | ICD-10-CM | POA: Diagnosis not present

## 2018-08-26 DIAGNOSIS — Z950 Presence of cardiac pacemaker: Secondary | ICD-10-CM | POA: Diagnosis not present

## 2018-08-26 DIAGNOSIS — Z91040 Latex allergy status: Secondary | ICD-10-CM | POA: Insufficient documentation

## 2018-08-26 DIAGNOSIS — R1031 Right lower quadrant pain: Secondary | ICD-10-CM | POA: Diagnosis not present

## 2018-08-26 HISTORY — DX: Unspecified intestinal obstruction, unspecified as to partial versus complete obstruction: K56.609

## 2018-08-26 LAB — URINALYSIS, ROUTINE W REFLEX MICROSCOPIC
BILIRUBIN URINE: NEGATIVE
Bacteria, UA: NONE SEEN
GLUCOSE, UA: NEGATIVE mg/dL
KETONES UR: NEGATIVE mg/dL
LEUKOCYTE UA: NEGATIVE
NITRITE: NEGATIVE
Protein, ur: NEGATIVE mg/dL
Specific Gravity, Urine: 1.01 (ref 1.005–1.030)
pH: 7 (ref 5.0–8.0)

## 2018-08-26 LAB — COMPREHENSIVE METABOLIC PANEL
ALT: 15 U/L (ref 0–44)
ANION GAP: 8 (ref 5–15)
AST: 21 U/L (ref 15–41)
Albumin: 3.9 g/dL (ref 3.5–5.0)
Alkaline Phosphatase: 89 U/L (ref 38–126)
BUN: 22 mg/dL (ref 8–23)
CHLORIDE: 106 mmol/L (ref 98–111)
CO2: 26 mmol/L (ref 22–32)
Calcium: 9 mg/dL (ref 8.9–10.3)
Creatinine, Ser: 0.89 mg/dL (ref 0.44–1.00)
GFR calc Af Amer: >60 mL/min (ref >60)
GFR calc non Af Amer: 60 mL/min — ABNORMAL LOW (ref >60)
Glucose, Bld: 106 mg/dL — ABNORMAL HIGH (ref 70–99)
POTASSIUM: 3.8 mmol/L (ref 3.5–5.1)
SODIUM: 140 mmol/L (ref 135–145)
TOTAL PROTEIN: 7.1 g/dL (ref 6.5–8.1)
Total Bilirubin: 0.7 mg/dL (ref 0.3–1.2)

## 2018-08-26 LAB — CBC WITH DIFFERENTIAL/PLATELET
ABS IMMATURE GRANULOCYTES: 0.04 10*3/uL (ref 0.00–0.07)
BASOS PCT: 1 %
Basophils Absolute: 0 10*3/uL (ref 0.0–0.1)
Eosinophils Absolute: 0.1 10*3/uL (ref 0.0–0.5)
Eosinophils Relative: 2 %
HCT: 40.5 % (ref 36.0–46.0)
HEMOGLOBIN: 12.5 g/dL (ref 12.0–15.0)
IMMATURE GRANULOCYTES: 1 %
LYMPHS PCT: 20 %
Lymphs Abs: 1.6 10*3/uL (ref 0.7–4.0)
MCH: 27.5 pg (ref 26.0–34.0)
MCHC: 30.9 g/dL (ref 30.0–36.0)
MCV: 89 fL (ref 80.0–100.0)
Monocytes Absolute: 0.7 10*3/uL (ref 0.1–1.0)
Monocytes Relative: 9 %
NEUTROS ABS: 5.6 10*3/uL (ref 1.7–7.7)
NEUTROS PCT: 67 %
PLATELETS: 195 10*3/uL (ref 150–400)
RBC: 4.55 MIL/uL (ref 3.87–5.11)
RDW: 15.2 % (ref 11.5–15.5)
WBC: 8.2 10*3/uL (ref 4.0–10.5)
nRBC: 0 % (ref 0.0–0.2)

## 2018-08-26 LAB — LIPASE, BLOOD: LIPASE: 28 U/L (ref 11–51)

## 2018-08-26 MED ORDER — SODIUM CHLORIDE 0.9 % IV BOLUS
500.0000 mL | Freq: Once | INTRAVENOUS | Status: AC
  Administered 2018-08-26: 500 mL via INTRAVENOUS

## 2018-08-26 MED ORDER — ONDANSETRON HCL 4 MG/2ML IJ SOLN
4.0000 mg | Freq: Once | INTRAMUSCULAR | Status: AC
  Administered 2018-08-26: 4 mg via INTRAVENOUS
  Filled 2018-08-26: qty 2

## 2018-08-26 MED ORDER — FENTANYL CITRATE (PF) 100 MCG/2ML IJ SOLN
50.0000 ug | Freq: Once | INTRAMUSCULAR | Status: AC
  Administered 2018-08-26: 50 ug via INTRAVENOUS
  Filled 2018-08-26: qty 2

## 2018-08-26 NOTE — ED Triage Notes (Signed)
Pt reports having abdominal pain for the last day and concerned for bowel obstruction. Pt reports prior hx with surgery of bowel obstruction.

## 2018-08-26 NOTE — ED Notes (Signed)
Patient states that she thinks she has a bowel obstruction, 10/10 pain in RLQ, tender to touch. Said her last normal BM was x2 days ago, but she hasn't been eating much, also reports nausea with no episodes of vomiting.

## 2018-08-26 NOTE — ED Provider Notes (Signed)
[facility] DEPT Provider Note   CSN: [id number] Arrival date & time: 08/26/18  2135    History   Chief Complaint Chief Complaint  Patient presents with  . Abdominal Pain    HPI Robin Bennett is a 85 y.o. female.     HPI   She presents for evaluation of right lower quadrant abdominal pain associated with decreased appetite, and decreased stooling since yesterday.  History seen multiple times for small bowel obstruction.  No recent abdominal interventions with surgery however she is previously had multiple procedures for bowel obstructions including abscess, and partial bowel removal.  Also status post cholecystectomy.  She did not vomit today but now has nausea.  She was able to eat some food today.  She denies dysuria or urinary frequency.  She denies fever, chills, focal weakness or paresthesia.  There are no other known modifying factors.  Past Medical History:  Diagnosis Date  . Bowel obstruction ([place])   . Renal disorder    Kidney stone    There are no active problems to display for this patient.   Past Surgical History:  Procedure Laterality Date  . BACK SURGERY    . BREAST LUMPECTOMY    . GASTRIC BYPASS    . PACEMAKER IMPLANT       OB History   No obstetric history on file.      Home Medications    Prior to Admission medications   Medication Sig Start Date End Date Taking? Authorizing Provider  cyclobenzaprine (FLEXERIL) 5 MG tablet Take 5 mg by mouth at bedtime as needed for muscle spasms.    [provider]  furosemide (LASIX) 20 MG tablet Take 20 mg by mouth 2 (two) times daily.    [provider]  gabapentin (NEURONTIN) 100 MG capsule Take 100 mg by mouth 2 (two) times daily as needed (pain).    [provider]  Lidocaine 4 % PTCH Apply 1 patch topically daily as needed (pain).    [provider]  naproxen sodium (ALEVE) 220 MG tablet Take 220 mg by mouth as needed (pain).     [provider]  sucralfate (CARAFATE) 1 GM/10ML suspension Take 1 g by mouth every morning. On a empty stomach    [provider]  warfarin (COUMADIN) 3 MG tablet Take 3 mg by mouth one time only at 6 PM.    [provider]    Family History Family History  Problem Relation Age of Onset  . Heart attack Father     Social History Social History   Tobacco Use  . Smoking status: Never Smoker  . Smokeless tobacco: Never Used  Substance Use Topics  . Alcohol use: Not Currently  . Drug use: Not Currently     Allergies   Codeine; Latex; and Morphine and related   Review of Systems Review of Systems  All other systems reviewed and are negative.    Physical Exam Updated Vital Signs BP 134/73   Pulse 70   Temp 97.7 F (36.5 C) (Oral)   Resp 12   Ht 5\' 1"  (1.549 m)   Wt 56.2 kg   SpO2 97%   BMI 23.43 kg/m   Physical Exam Vitals signs and nursing note reviewed.  Constitutional:      General: She is in acute distress.     Appearance: She is well-developed. She is not ill-appearing, toxic-appearing or diaphoretic.     Comments: Elderly, frail  HENT:  Head: Normocephalic and atraumatic.     Right Ear: External ear normal.     Left Ear: External ear normal.  Eyes:     Conjunctiva/sclera: Conjunctivae normal.     Pupils: Pupils are equal, round, and reactive to light.  Neck:     Musculoskeletal: Normal range of motion and neck supple.     Trachea: Phonation normal.  Cardiovascular:     Rate and Rhythm: Normal rate and regular rhythm.     Heart sounds: Normal heart sounds.  Pulmonary:     Effort: Pulmonary effort is normal.     Breath sounds: Normal breath sounds.  Abdominal:     General: There is no distension.     Palpations: Abdomen is soft. There is no mass.     Tenderness: There is abdominal tenderness (Right lower quadrant, moderate.). There is no guarding or rebound.     Hernia: No hernia is present.  Genitourinary:     Comments: No costovertebral angle tenderness. Musculoskeletal: Normal range of motion.        General: No swelling or tenderness.     Right lower leg: No edema.     Left lower leg: No edema.  Skin:    General: Skin is warm and dry.  Neurological:     Mental Status: She is alert and oriented to person, place, and time.     Cranial Nerves: No cranial nerve deficit.     Sensory: No sensory deficit.     Motor: No abnormal muscle tone.     Coordination: Coordination normal.  Psychiatric:        Mood and Affect: Mood normal.        Behavior: Behavior normal.        Thought Content: Thought content normal.        Judgment: Judgment normal.      ED Treatments / Results  Labs (all labs ordered are listed, but only abnormal results are displayed) Labs Reviewed  COMPREHENSIVE METABOLIC PANEL - Abnormal; Notable for the following components:      Result Value   Glucose, Bld 106 (*)    GFR calc non Af Amer 60 (*)    All other components within normal limits  URINALYSIS, ROUTINE W REFLEX MICROSCOPIC - Abnormal; Notable for the following components:   Color, Urine STRAW (*)    Hgb urine dipstick MODERATE (*)    RBC / HPF >50 (*)    All other components within normal limits  CBC WITH DIFFERENTIAL/PLATELET  LIPASE, BLOOD    EKG None  Radiology No results found.  Procedures Procedures (including critical care time)  Medications Ordered in ED Medications  fentaNYL (SUBLIMAZE) injection 50 mcg (50 mcg Intravenous Given 08/26/18 2217)  ondansetron (ZOFRAN) injection 4 mg (4 mg Intravenous Given 08/26/18 2217)  sodium chloride 0.9 % bolus 500 mL (0 mLs Intravenous Stopped 08/26/18 2258)     Initial Impression / Assessment and Plan / ED Course  I have reviewed the triage vital signs and the nursing notes.  Pertinent labs & imaging results that were available during my care of the patient were reviewed by me and considered in my medical decision making (see chart for details).   Clinical Course as of Aug 26 2342  Sat Aug 26, 2018  2308 Normal except increased hemoglobin and RBCs.  Urinalysis, Routine w reflex microscopic(!) [EW]  2308 Normal  CBC with Differential [EW]  2327 Normal  Lipase, blood [EW]  2328 Normal except mild glucose elevation  Comprehensive metabolic panel(!) [EW]    Clinical Course User Index [EW] Daleen Bo, MD        Patient Vitals for the past 24 hrs:  BP Temp Temp src Pulse Resp SpO2 Height Weight  08/26/18 2300 134/73 - - 70 12 97 % - -  08/26/18 2230 131/73 - - 70 12 97 % - -  08/26/18 2141 (!) 152/70 97.7 F (36.5 C) Oral 70 20 99 % 5\' 1"  (1.549 m) 56.2 kg    11:44 PM Reevaluation with update and discussion. After initial assessment and treatment, an updated evaluation reveals no change in status, patient updated on findings and plan. Daleen Bo   Medical Decision Making: Abdominal pain nonspecific with right lower quadrant discomfort.  Hematuria present.  No recent episodes of kidney stones.  Imaging ordered to evaluate for etiology.  Doubt serious bacterial infection, metabolic instability or impending vascular collapse.  CRITICAL CARE-no Performed by: Daleen Bo  Nursing Notes Reviewed/ Care Coordinated Applicable Imaging Reviewed Interpretation of Laboratory Data incorporated into ED treatment  Plan-disposition by oncoming provider team following return of CT imaging  Final Clinical Impressions(s) / ED Diagnoses   Final diagnoses:  Right lower quadrant abdominal pain  Hematuria, unspecified type    ED Discharge Orders    None       Daleen Bo, MD 08/26/18 2347

## 2018-08-27 ENCOUNTER — Encounter (HOSPITAL_COMMUNITY): Payer: Self-pay

## 2018-08-27 ENCOUNTER — Emergency Department (HOSPITAL_COMMUNITY): Payer: Medicare Other

## 2018-08-27 DIAGNOSIS — N200 Calculus of kidney: Secondary | ICD-10-CM | POA: Diagnosis not present

## 2018-08-27 DIAGNOSIS — R1031 Right lower quadrant pain: Secondary | ICD-10-CM | POA: Diagnosis not present

## 2018-08-27 MED ORDER — KETOROLAC TROMETHAMINE 30 MG/ML IJ SOLN
15.0000 mg | Freq: Once | INTRAMUSCULAR | Status: AC
  Administered 2018-08-27: 15 mg via INTRAVENOUS
  Filled 2018-08-27: qty 1

## 2018-08-27 MED ORDER — TAMSULOSIN HCL 0.4 MG PO CAPS: 0.4000 mg | ORAL_CAPSULE | Freq: Every day | ORAL | 0 refills | Status: DC

## 2018-08-27 MED ORDER — TAMSULOSIN HCL 0.4 MG PO CAPS
0.4000 mg | ORAL_CAPSULE | Freq: Once | ORAL | Status: AC
  Administered 2018-08-27: 0.4 mg via ORAL
  Filled 2018-08-27: qty 1

## 2018-08-27 MED ORDER — SODIUM CHLORIDE (PF) 0.9 % IJ SOLN
INTRAMUSCULAR | Status: AC
  Filled 2018-08-27: qty 50

## 2018-08-27 MED ORDER — ONDANSETRON 4 MG PO TBDP: ORAL_TABLET | ORAL | 0 refills | Status: DC

## 2018-08-27 MED ORDER — IOHEXOL 300 MG/ML  SOLN
100.0000 mL | Freq: Once | INTRAMUSCULAR | Status: AC | PRN
  Administered 2018-08-27: 100 mL via INTRAVENOUS

## 2018-08-27 MED ORDER — IBUPROFEN 800 MG PO TABS: 800.0000 mg | ORAL_TABLET | Freq: Three times a day (TID) | ORAL | 0 refills | Status: DC

## 2018-08-27 MED ORDER — PROMETHAZINE HCL 25 MG PO TABS: 25.0000 mg | ORAL_TABLET | Freq: Four times a day (QID) | ORAL | 0 refills | Status: AC | PRN

## 2018-08-27 MED ORDER — FENTANYL CITRATE (PF) 100 MCG/2ML IJ SOLN
50.0000 ug | Freq: Once | INTRAMUSCULAR | Status: AC
  Administered 2018-08-27: 50 ug via INTRAVENOUS
  Filled 2018-08-27: qty 2

## 2018-08-27 MED ORDER — OXYCODONE-ACETAMINOPHEN 5-325 MG PO TABS: 1.0000 | ORAL_TABLET | Freq: Three times a day (TID) | ORAL | 0 refills | Status: DC | PRN

## 2018-08-27 NOTE — ED Notes (Signed)
Patient transported back from CT 

## 2018-08-27 NOTE — ED Notes (Signed)
Patient transported to CT via stretcher.

## 2018-08-28 DIAGNOSIS — Z7901 Long term (current) use of anticoagulants: Secondary | ICD-10-CM | POA: Diagnosis not present

## 2018-09-04 ENCOUNTER — Ambulatory Visit (INDEPENDENT_AMBULATORY_CARE_PROVIDER_SITE_OTHER): Payer: Medicare Other | Admitting: *Deleted

## 2018-09-04 ENCOUNTER — Other Ambulatory Visit: Payer: Self-pay

## 2018-09-04 DIAGNOSIS — I442 Atrioventricular block, complete: Secondary | ICD-10-CM | POA: Diagnosis not present

## 2018-09-04 DIAGNOSIS — I482 Chronic atrial fibrillation, unspecified: Secondary | ICD-10-CM

## 2018-09-04 LAB — CUP PACEART REMOTE DEVICE CHECK
Battery Remaining Longevity: 102 mo
Battery Remaining Percentage: 100 %
Implantable Lead Implant Date: 20150320
Implantable Lead Location: 753858
Implantable Lead Model: 4136
Implantable Pulse Generator Implant Date: 20150320
Lead Channel Impedance Value: 1521 Ohm
Lead Channel Pacing Threshold Amplitude: 0.6 V
Lead Channel Pacing Threshold Amplitude: 1.2 V
Lead Channel Setting Pacing Amplitude: 2 V
Lead Channel Setting Pacing Pulse Width: 0.4 ms
Lead Channel Setting Pacing Pulse Width: 0.5 ms
Lead Channel Setting Sensing Sensitivity: 2.5 mV
MDC IDC LEAD IMPLANT DT: 20150320
MDC IDC LEAD LOCATION: 753860
MDC IDC LEAD SERIAL: 29610206
MDC IDC MSMT LEADCHNL LV PACING THRESHOLD PULSEWIDTH: 0.5 ms
MDC IDC MSMT LEADCHNL RV IMPEDANCE VALUE: 783 Ohm
MDC IDC MSMT LEADCHNL RV PACING THRESHOLD PULSEWIDTH: 0.4 ms
MDC IDC SESS DTM: 20200330074100
MDC IDC SET LEADCHNL LV PACING AMPLITUDE: 2.3 V
MDC IDC SET LEADCHNL RV SENSING SENSITIVITY: 2.5 mV
MDC IDC STAT BRADY RA PERCENT PACED: 0 %
MDC IDC STAT BRADY RV PERCENT PACED: 98 %
Pulse Gen Serial Number: 101298

## 2018-09-11 DIAGNOSIS — I4819 Other persistent atrial fibrillation: Secondary | ICD-10-CM | POA: Diagnosis not present

## 2018-09-11 DIAGNOSIS — Z952 Presence of prosthetic heart valve: Secondary | ICD-10-CM | POA: Diagnosis not present

## 2018-09-11 DIAGNOSIS — K219 Gastro-esophageal reflux disease without esophagitis: Secondary | ICD-10-CM | POA: Diagnosis not present

## 2018-09-11 DIAGNOSIS — E538 Deficiency of other specified B group vitamins: Secondary | ICD-10-CM | POA: Diagnosis not present

## 2018-09-11 DIAGNOSIS — Z0001 Encounter for general adult medical examination with abnormal findings: Secondary | ICD-10-CM | POA: Diagnosis not present

## 2018-09-11 DIAGNOSIS — Z79899 Other long term (current) drug therapy: Secondary | ICD-10-CM | POA: Diagnosis not present

## 2018-09-11 DIAGNOSIS — Z7901 Long term (current) use of anticoagulants: Secondary | ICD-10-CM | POA: Diagnosis not present

## 2018-09-11 NOTE — Progress Notes (Signed)
Remote pacemaker transmission.   

## 2018-09-18 DIAGNOSIS — Z7901 Long term (current) use of anticoagulants: Secondary | ICD-10-CM | POA: Diagnosis not present

## 2018-09-27 DIAGNOSIS — L905 Scar conditions and fibrosis of skin: Secondary | ICD-10-CM | POA: Diagnosis not present

## 2018-09-27 DIAGNOSIS — C44622 Squamous cell carcinoma of skin of right upper limb, including shoulder: Secondary | ICD-10-CM | POA: Diagnosis not present

## 2018-09-27 DIAGNOSIS — C4442 Squamous cell carcinoma of skin of scalp and neck: Secondary | ICD-10-CM | POA: Diagnosis not present

## 2018-09-27 DIAGNOSIS — D485 Neoplasm of uncertain behavior of skin: Secondary | ICD-10-CM | POA: Diagnosis not present

## 2018-09-29 DIAGNOSIS — Z7901 Long term (current) use of anticoagulants: Secondary | ICD-10-CM | POA: Diagnosis not present

## 2018-10-03 DIAGNOSIS — N202 Calculus of kidney with calculus of ureter: Secondary | ICD-10-CM | POA: Diagnosis not present

## 2018-10-13 DIAGNOSIS — Z7901 Long term (current) use of anticoagulants: Secondary | ICD-10-CM | POA: Diagnosis not present

## 2018-10-19 DIAGNOSIS — D0461 Carcinoma in situ of skin of right upper limb, including shoulder: Secondary | ICD-10-CM | POA: Diagnosis not present

## 2018-11-03 DIAGNOSIS — N202 Calculus of kidney with calculus of ureter: Secondary | ICD-10-CM | POA: Diagnosis not present

## 2018-11-10 DIAGNOSIS — Z7901 Long term (current) use of anticoagulants: Secondary | ICD-10-CM | POA: Diagnosis not present

## 2018-11-21 DIAGNOSIS — N202 Calculus of kidney with calculus of ureter: Secondary | ICD-10-CM | POA: Diagnosis not present

## 2018-11-21 DIAGNOSIS — N200 Calculus of kidney: Secondary | ICD-10-CM | POA: Diagnosis not present

## 2018-11-23 DIAGNOSIS — N202 Calculus of kidney with calculus of ureter: Secondary | ICD-10-CM | POA: Diagnosis not present

## 2018-11-27 DIAGNOSIS — Z7901 Long term (current) use of anticoagulants: Secondary | ICD-10-CM | POA: Diagnosis not present

## 2018-12-01 ENCOUNTER — Other Ambulatory Visit: Payer: Self-pay | Admitting: Urology

## 2018-12-03 DIAGNOSIS — K219 Gastro-esophageal reflux disease without esophagitis: Secondary | ICD-10-CM | POA: Diagnosis not present

## 2018-12-03 DIAGNOSIS — Z79899 Other long term (current) drug therapy: Secondary | ICD-10-CM | POA: Diagnosis not present

## 2018-12-03 DIAGNOSIS — Z791 Long term (current) use of non-steroidal anti-inflammatories (NSAID): Secondary | ICD-10-CM | POA: Diagnosis not present

## 2018-12-03 DIAGNOSIS — G8911 Acute pain due to trauma: Secondary | ICD-10-CM | POA: Diagnosis not present

## 2018-12-03 DIAGNOSIS — M1990 Unspecified osteoarthritis, unspecified site: Secondary | ICD-10-CM | POA: Diagnosis not present

## 2018-12-03 DIAGNOSIS — Z859 Personal history of malignant neoplasm, unspecified: Secondary | ICD-10-CM | POA: Diagnosis not present

## 2018-12-03 DIAGNOSIS — S6991XA Unspecified injury of right wrist, hand and finger(s), initial encounter: Secondary | ICD-10-CM | POA: Diagnosis not present

## 2018-12-03 DIAGNOSIS — M25531 Pain in right wrist: Secondary | ICD-10-CM | POA: Diagnosis not present

## 2018-12-03 DIAGNOSIS — Z79890 Hormone replacement therapy (postmenopausal): Secondary | ICD-10-CM | POA: Diagnosis not present

## 2018-12-03 DIAGNOSIS — Z7901 Long term (current) use of anticoagulants: Secondary | ICD-10-CM | POA: Diagnosis not present

## 2018-12-03 DIAGNOSIS — M25562 Pain in left knee: Secondary | ICD-10-CM | POA: Diagnosis not present

## 2018-12-03 DIAGNOSIS — M810 Age-related osteoporosis without current pathological fracture: Secondary | ICD-10-CM | POA: Diagnosis not present

## 2018-12-03 DIAGNOSIS — M25561 Pain in right knee: Secondary | ICD-10-CM | POA: Diagnosis not present

## 2018-12-03 DIAGNOSIS — M79631 Pain in right forearm: Secondary | ICD-10-CM | POA: Diagnosis not present

## 2018-12-04 ENCOUNTER — Encounter: Payer: Medicare Other | Admitting: *Deleted

## 2018-12-05 ENCOUNTER — Telehealth: Payer: Self-pay

## 2018-12-05 NOTE — Telephone Encounter (Signed)
Left message for patient to remind of missed remote transmission.  

## 2018-12-12 DIAGNOSIS — I482 Chronic atrial fibrillation, unspecified: Secondary | ICD-10-CM | POA: Diagnosis not present

## 2018-12-12 DIAGNOSIS — M25531 Pain in right wrist: Secondary | ICD-10-CM | POA: Diagnosis not present

## 2018-12-12 DIAGNOSIS — Z01818 Encounter for other preprocedural examination: Secondary | ICD-10-CM | POA: Diagnosis not present

## 2018-12-12 DIAGNOSIS — Z7901 Long term (current) use of anticoagulants: Secondary | ICD-10-CM | POA: Diagnosis not present

## 2018-12-12 DIAGNOSIS — Z952 Presence of prosthetic heart valve: Secondary | ICD-10-CM | POA: Diagnosis not present

## 2018-12-13 ENCOUNTER — Telehealth: Payer: Self-pay | Admitting: Internal Medicine

## 2018-12-13 ENCOUNTER — Ambulatory Visit (INDEPENDENT_AMBULATORY_CARE_PROVIDER_SITE_OTHER): Payer: Medicare Other | Admitting: *Deleted

## 2018-12-13 DIAGNOSIS — I442 Atrioventricular block, complete: Secondary | ICD-10-CM | POA: Diagnosis not present

## 2018-12-13 LAB — CUP PACEART REMOTE DEVICE CHECK
Battery Remaining Longevity: 102 mo
Battery Remaining Percentage: 100 %
Brady Statistic RA Percent Paced: 0 %
Brady Statistic RV Percent Paced: 98 %
Date Time Interrogation Session: 20200707161600
Implantable Lead Implant Date: 20150320
Implantable Lead Implant Date: 20150320
Implantable Lead Location: 753858
Implantable Lead Location: 753860
Implantable Lead Model: 4136
Implantable Lead Serial Number: 29610206
Implantable Pulse Generator Implant Date: 20150320
Lead Channel Impedance Value: 1683 Ohm
Lead Channel Impedance Value: 842 Ohm
Lead Channel Pacing Threshold Amplitude: 0.7 V
Lead Channel Pacing Threshold Amplitude: 1.2 V
Lead Channel Pacing Threshold Pulse Width: 0.4 ms
Lead Channel Pacing Threshold Pulse Width: 0.5 ms
Lead Channel Setting Pacing Amplitude: 2 V
Lead Channel Setting Pacing Amplitude: 2.3 V
Lead Channel Setting Pacing Pulse Width: 0.4 ms
Lead Channel Setting Pacing Pulse Width: 0.5 ms
Lead Channel Setting Sensing Sensitivity: 2.5 mV
Lead Channel Setting Sensing Sensitivity: 2.5 mV
Pulse Gen Serial Number: 101298

## 2018-12-13 NOTE — Telephone Encounter (Signed)
I spoke with pt. She has upcoming surgery planned for removal of kidney stones. I asked her to contact physician doing procedure and have office send [place] surgical clearance request.  Will forward to [place] regarding possible need for appointment with Dr. Lovena Le prior to surgery.

## 2018-12-13 NOTE — Telephone Encounter (Signed)
New Message              Patient is calling to get surgery clearance before Aug 10th, there is nothing available, pls call to advise.

## 2018-12-25 ENCOUNTER — Encounter: Payer: Self-pay | Admitting: Cardiology

## 2018-12-25 NOTE — Progress Notes (Signed)
Remote pacemaker transmission.   

## 2019-01-01 ENCOUNTER — Other Ambulatory Visit: Payer: Self-pay | Admitting: Family Medicine

## 2019-01-01 ENCOUNTER — Telehealth: Payer: Self-pay | Admitting: Internal Medicine

## 2019-01-01 ENCOUNTER — Ambulatory Visit
Admission: RE | Admit: 2019-01-01 | Discharge: 2019-01-01 | Disposition: A | Discharge status: Home / Self Care | Payer: Medicare Other | Source: Ambulatory Visit | Attending: Family Medicine | Admitting: Family Medicine

## 2019-01-01 DIAGNOSIS — Z01818 Encounter for other preprocedural examination: Secondary | ICD-10-CM

## 2019-01-01 DIAGNOSIS — Z950 Presence of cardiac pacemaker: Secondary | ICD-10-CM | POA: Diagnosis not present

## 2019-01-01 NOTE — Telephone Encounter (Signed)

## 2019-01-02 ENCOUNTER — Ambulatory Visit (INDEPENDENT_AMBULATORY_CARE_PROVIDER_SITE_OTHER): Payer: Medicare Other | Admitting: Internal Medicine

## 2019-01-02 ENCOUNTER — Other Ambulatory Visit: Payer: Self-pay

## 2019-01-02 ENCOUNTER — Encounter: Payer: Self-pay | Admitting: Internal Medicine

## 2019-01-02 DIAGNOSIS — Z950 Presence of cardiac pacemaker: Secondary | ICD-10-CM | POA: Insufficient documentation

## 2019-01-02 DIAGNOSIS — I442 Atrioventricular block, complete: Secondary | ICD-10-CM | POA: Diagnosis not present

## 2019-01-02 DIAGNOSIS — I872 Venous insufficiency (chronic) (peripheral): Secondary | ICD-10-CM | POA: Diagnosis not present

## 2019-01-02 DIAGNOSIS — I5022 Chronic systolic (congestive) heart failure: Secondary | ICD-10-CM | POA: Diagnosis not present

## 2019-01-02 NOTE — Patient Instructions (Signed)
Medication Instructions:  Your physician recommends that you continue on your current medications as directed. Please refer to the Current Medication list given to you today.  Labwork: None ordered.  Testing/Procedures: None ordered.  Follow-Up: Your physician wants you to follow-up in: one year with Dr. Lovena Le.   You will receive a reminder letter in the mail two months in advance. If you don't receive a letter, please call our office to schedule the follow-up appointment.  Remote monitoring is used to monitor your Pacemaker from home. This monitoring reduces the number of office visits required to check your device to one time per year. It allows [place] to keep an eye on the functioning of your device to ensure it is working properly. You are scheduled for a device check from home on 03/14/2019. You may send your transmission at any time that day. If you have a wireless device, the transmission will be sent automatically. After your physician reviews your transmission, you will receive a postcard with your next transmission date.  Any Other Special Instructions Will Be Listed Below (If Applicable).  If you need a refill on your cardiac medications before your next appointment, please call your pharmacy.

## 2019-01-02 NOTE — Progress Notes (Signed)
HPI [name] returns today for followup. She is a pleasant 85 yo woman with a h/o AS, S/p TAVR, AV node ablation s/p PPM insertion. She has done well in the interim except for kidney stones. She is pending treatment of her stones and presents for surgical clearance. She denies chest pain or sob except for occaisional sob at night.  Allergies  Allergen Reactions  . Codeine     Do not work.   . Latex Hives  . Morphine And Related Other (See Comments)    Doesn't work for her pain     Current Outpatient Medications  Medication Sig Dispense Refill  . cyclobenzaprine (FLEXERIL) 5 MG tablet Take 5 mg by mouth at bedtime as needed for muscle spasms.    . furosemide (LASIX) 20 MG tablet Take 20 mg by mouth 2 (two) times daily.    Marland Kitchen gabapentin (NEURONTIN) 100 MG capsule Take 100 mg by mouth 2 (two) times daily as needed (pain).    Marland Kitchen ibuprofen (ADVIL,MOTRIN) 800 MG tablet Take 1 tablet (800 mg total) by mouth 3 (three) times daily. 21 tablet 0  . Lidocaine 4 % PTCH Apply 1 patch topically daily as needed (pain).    . naproxen sodium (ALEVE) 220 MG tablet Take 220 mg by mouth as needed (pain).    Marland Kitchen oxyCODONE-acetaminophen (PERCOCET) 5-325 MG tablet Take 1 tablet by mouth every 8 (eight) hours as needed for severe pain. 12 tablet 0  . promethazine (PHENERGAN) 25 MG tablet Take 1 tablet (25 mg total) by mouth every 6 (six) hours as needed for nausea or vomiting. 10 tablet 0  . sucralfate (CARAFATE) 1 GM/10ML suspension Take 1 g by mouth every morning. On a empty stomach    . tamsulosin (FLOMAX) 0.4 MG CAPS capsule Take 1 capsule (0.4 mg total) by mouth daily. 10 capsule 0  . warfarin (COUMADIN) 3 MG tablet Take 3 mg by mouth daily. Used as directed     No current facility-administered medications for this visit.      Past Medical History:  Diagnosis Date  . Bowel obstruction ([place])   . Renal disorder    Kidney stone    ROS:   All systems reviewed and negative except as noted in  the HPI.   Past Surgical History:  Procedure Laterality Date  . BACK SURGERY    . BREAST LUMPECTOMY    . GASTRIC BYPASS    . PACEMAKER IMPLANT       Family History  Problem Relation Age of Onset  . Heart attack Father      Social History   Socioeconomic History  . Marital status: Widowed    Spouse name: Not on file  . Number of children: Not on file  . Years of education: Not on file  . Highest education level: Not on file  Occupational History  . Not on file  Social Needs  . Financial resource strain: Not on file  . Food insecurity    Worry: Not on file    Inability: Not on file  . Transportation needs    Medical: Not on file    Non-medical: Not on file  Tobacco Use  . Smoking status: Never Smoker  . Smokeless tobacco: Never Used  Substance and Sexual Activity  . Alcohol use: Not Currently  . Drug use: Not Currently  . Sexual activity: Not on file  Lifestyle  . Physical activity    Days per week: Not on file  Minutes per session: Not on file  . Stress: Not on file  Relationships  . Social Herbalist on phone: Not on file    Gets together: Not on file    Attends religious service: Not on file    Active member of club or organization: Not on file    Attends meetings of clubs or organizations: Not on file    Relationship status: Not on file  . Intimate partner violence    Fear of current or ex partner: Not on file    Emotionally abused: Not on file    Physically abused: Not on file    Forced sexual activity: Not on file  Other Topics Concern  . Not on file  Social History Narrative  . Not on file     BP 116/64   Pulse 70   Ht 5\' 1"  (1.549 m)   Wt 129 lb (58.5 kg)   SpO2 97%   BMI 24.37 kg/m   Physical Exam:  Well appearing NAD HEENT: Unremarkable Neck:  No JVD, no thyromegally Lymphatics:  No adenopathy Back:  No CVA tenderness Lungs:  Clear with no wheezes HEART:  Regular rate rhythm, no murmurs, no rubs, no clicks Abd:   soft, positive bowel sounds, no organomegally, no rebound, no guarding Ext:  2 plus pulses, no edema, no cyanosis, no clubbing Skin:  No rashes no nodules Neuro:  CN II through XII intact, motor grossly intact  EKG - atrial fib with biv pacing  DEVICE  Normal device function.  See PaceArt for details.   Assess/Plan: 1. Preoperative clearance - the patient is an acceptable surgical risk, despite her age. She may proceed with surgery. 2. CHB - she is s/p AV node ablation and is asymptomatic s/p PPM insertion.  3. PPM - her [employer] PPM is working normally. 4. Chronic systolic heart failure - her symptoms are class 2. She is encouraged to maintain a low sodium diet.   Mikle Bosworth.D.

## 2019-01-09 DIAGNOSIS — N202 Calculus of kidney with calculus of ureter: Secondary | ICD-10-CM | POA: Diagnosis not present

## 2019-01-09 DIAGNOSIS — L89101 Pressure ulcer of unspecified part of back, stage 1: Secondary | ICD-10-CM | POA: Diagnosis not present

## 2019-01-09 DIAGNOSIS — I480 Paroxysmal atrial fibrillation: Secondary | ICD-10-CM | POA: Diagnosis not present

## 2019-01-09 DIAGNOSIS — Z952 Presence of prosthetic heart valve: Secondary | ICD-10-CM | POA: Diagnosis not present

## 2019-01-10 NOTE — Patient Instructions (Addendum)
YOU NEED TO HAVE A COVID 19 TEST ON___Thurs 8/6____ @__12 :20_____, THIS TEST MUST BE DONE BEFORE SURGERY, COME  [place] [place] , [address]. ONCE YOUR COVID TEST IS COMPLETED, PLEASE BEGIN THE QUARANTINE INSTRUCTIONS AS OUTLINED IN YOUR HANDOUT.                Robin Bennett    Your procedure is scheduled on: Monday 01/15/19   Report to [facility] Main  Entrance Report to admitting at  9:15 AM   1 VISITOR IS ALLOWED TO WAIT IN WAITING ROOM  ONLY DAY OF YOUR SURGERY.  No family in Short Stay at this time.   Call this number if you have problems the morning of surgery [phone]    Remember: Do not eat food or drink liquids :After Midnight.             BRUSH YOUR TEETH MORNING OF SURGERY AND RINSE YOUR MOUTH OUT, NO CHEWING GUM CANDY OR MINTS.     Take these medicines the morning of surgery with A SIP OF WATER:  Flomax                                 You may not have any metal on your body including piercings               Do not wear jewelry, make-up, lotions, powders or perfumes, deodorant             Do not wear nail polish.  Do not shave  48 hours prior to surgery.             Do not bring valuables to the hospital. [place].  Contacts, dentures or bridgework may not be worn into surgery.      Patients discharged the day of surgery will not be allowed to drive home.  IF YOU ARE HAVING SURGERY AND GOING HOME THE SAME DAY, YOU MUST HAVE AN ADULT TO DRIVE YOU HOME AND BE WITH YOU FOR 24 HOURS.  YOU MAY GO HOME BY TAXI OR UBER OR ORTHERWISE, BUT AN ADULT MUST ACCOMPANY YOU HOME AND STAY WITH YOU FOR 24 HOURS.  Name and phone number of your driver:  Special Instructions: N/A           : [facility] - Preparing for Surgery Before surgery, you can play an important role.   Because skin is not sterile, your skin needs to be as free of germs as possible .  You can reduce the number of germs on your  skin by washing with CHG (chlorahexidine gluconate) soap before surgery.   CHG is an antiseptic cleaner which kills germs and bonds with the skin to continue killing germs even after washing. Please DO NOT use if you have an allergy to CHG or antibacterial soaps.   If your skin becomes reddened/irritated stop using the CHG and inform your nurse when you arrive at Short Stay. Do not shave (including legs and underarms) for at least 48 hours prior to the first CHG showerPlease follow these instructions carefully:  1.  Shower with CHG Soap the night before surgery and the  morning of Surgery.  2.  If you choose to wash your hair, wash your hair first as usual with your  normal  shampoo.  3.  After you shampoo, rinse your hair and body thoroughly to remove the  shampoo.                                        4.  Use CHG as you would any other liquid soap.  You can apply chg directly  to the skin and wash                       Gently with a scrungie or clean washcloth.  5.  Apply the CHG Soap to your body ONLY FROM THE NECK DOWN.   Do not use on face/ open                           Wound or open sores. Avoid contact with eyes, ears mouth and genitals (private parts).                       Wash face,  Genitals (private parts) with your normal soap.             6.  Wash thoroughly, paying special attention to the area where your surgery  will be performed.  7.  Thoroughly rinse your body with warm water from the neck down.  8.  DO NOT shower/wash with your normal soap after using and rinsing off  the CHG Soap.             9.  Pat yourself dry with a clean towel.            10.  Wear clean pajamas.            11.  Place clean sheets on your bed the night of your first shower and do not  sleep with pets. Day of Surgery : Do not apply any lotions/deodorants the morning of surgery.  Please wear clean clothes to the hospital/surgery center.  FAILURE TO FOLLOW THESE INSTRUCTIONS MAY RESULT IN THE  CANCELLATION OF YOUR SURGERY PATIENT SIGNATURE_________________________________  NURSE SIGNATURE__________________________________  ________________________________________________________________________

## 2019-01-11 ENCOUNTER — Other Ambulatory Visit: Payer: Self-pay

## 2019-01-11 ENCOUNTER — Other Ambulatory Visit (HOSPITAL_COMMUNITY)
Admission: RE | Admit: 2019-01-11 | Discharge: 2019-01-11 | Disposition: A | Discharge status: Home / Self Care | Payer: Medicare Other | Source: Ambulatory Visit | Attending: Urology | Admitting: Urology

## 2019-01-11 ENCOUNTER — Encounter (HOSPITAL_COMMUNITY): Payer: Self-pay

## 2019-01-11 ENCOUNTER — Telehealth: Payer: Self-pay | Admitting: Internal Medicine

## 2019-01-11 ENCOUNTER — Encounter (HOSPITAL_COMMUNITY)
Admission: RE | Admit: 2019-01-11 | Discharge: 2019-01-11 | Disposition: A | Discharge status: Home / Self Care | Payer: Medicare Other | Source: Ambulatory Visit | Attending: Urology | Admitting: Urology

## 2019-01-11 DIAGNOSIS — Z20828 Contact with and (suspected) exposure to other viral communicable diseases: Secondary | ICD-10-CM | POA: Diagnosis not present

## 2019-01-11 DIAGNOSIS — Z01812 Encounter for preprocedural laboratory examination: Secondary | ICD-10-CM | POA: Insufficient documentation

## 2019-01-11 HISTORY — DX: Personal history of urinary calculi: Z87.442

## 2019-01-11 HISTORY — DX: Heart failure, unspecified: I50.9

## 2019-01-11 HISTORY — DX: Malignant (primary) neoplasm, unspecified: C80.1

## 2019-01-11 LAB — CBC
HCT: 40.5 % (ref 36.0–46.0)
Hemoglobin: 12.3 g/dL (ref 12.0–15.0)
MCH: 27.3 pg (ref 26.0–34.0)
MCHC: 30.4 g/dL (ref 30.0–36.0)
MCV: 90 fL (ref 80.0–100.0)
Platelets: 185 10*3/uL (ref 150–400)
RBC: 4.5 MIL/uL (ref 3.87–5.11)
RDW: 15.2 % (ref 11.5–15.5)
WBC: 8.4 10*3/uL (ref 4.0–10.5)
nRBC: 0 % (ref 0.0–0.2)

## 2019-01-11 LAB — SURGICAL PCR SCREEN
MRSA, PCR: NEGATIVE
Staphylococcus aureus: NEGATIVE

## 2019-01-11 LAB — SARS CORONAVIRUS 2 (TAT 6-24 HRS): SARS Coronavirus 2: NEGATIVE

## 2019-01-11 LAB — BASIC METABOLIC PANEL
Anion gap: 9 (ref 5–15)
BUN: 19 mg/dL (ref 8–23)
CO2: 26 mmol/L (ref 22–32)
Calcium: 9.5 mg/dL (ref 8.9–10.3)
Chloride: 106 mmol/L (ref 98–111)
Creatinine, Ser: 0.86 mg/dL (ref 0.44–1.00)
GFR calc Af Amer: >60 mL/min (ref >60)
GFR calc non Af Amer: >60 mL/min (ref >60)
Glucose, Bld: 86 mg/dL (ref 70–99)
Potassium: 5 mmol/L (ref 3.5–5.1)
Sodium: 141 mmol/L (ref 135–145)

## 2019-01-11 NOTE — Telephone Encounter (Signed)
Pre-admission testing faxed over a form for Dr. Lovena Le to sign. It can be faxed over to [phone]

## 2019-01-11 NOTE — Progress Notes (Signed)
Dr. Alyson Ingles instructed patient to continue taking her coumadin.

## 2019-01-12 ENCOUNTER — Encounter (HOSPITAL_COMMUNITY): Payer: Self-pay

## 2019-01-12 DIAGNOSIS — Z7901 Long term (current) use of anticoagulants: Secondary | ICD-10-CM | POA: Diagnosis not present

## 2019-01-12 NOTE — Anesthesia Preprocedure Evaluation (Addendum)
Anesthesia Evaluation  Patient identified by MRN, date of birth, ID band Patient awake    Reviewed: Allergy & Precautions, NPO status , Patient's Chart, lab work & pertinent test results  History of Anesthesia Complications Negative for: history of anesthetic complications  Airway Mallampati: II  TM Distance: >3 FB Neck ROM: Full    Dental  (+) Lower Dentures, Upper Dentures   Pulmonary neg pulmonary ROS,    Pulmonary exam normal        Cardiovascular (-) anginaNormal cardiovascular exam+ dysrhythmias + pacemaker (for CHB) + Valvular Problems/Murmurs (s/p TAVR)    '17 TTE - EF is 55%. Moderate LV diastolic dysfunction. Pacing/ICD leads noted in right atrium and ventricle. Prosthetic aortic valve (bioprosthetic stent valve, TAVR).  Mild mitral regurgitation identified. Mild tricuspid regurgitation identified. RVSP at least 30-28mmHg. Possible pulmonary hypertension.  Pt last seen by cardiologist, Dr. Cristopher Peru, 01/02/2019.  Per his OV note, "Preoperative clearance-the patient is an acceptable surgical risk, despite her age.  She may proceed with surgery.  CHB-she is s/p AV node ablation and is asymptomatic s/p PPM insertion.  PPM- her [employer] PPM is working normally.  Chronic systolic heart failure -her symptoms are class 2.  She is encouraged to maintain a low sodium diet."    Neuro/Psych negative neurological ROS  negative psych ROS   GI/Hepatic Neg liver ROS, GERD  Medicated and Controlled, Hx gastric bypass    Endo/Other  negative endocrine ROS  Renal/GU Renal disease Kidney stones      Musculoskeletal negative musculoskeletal ROS (+)   Abdominal   Peds  Hematology negative hematology ROS (+)   Anesthesia Other Findings   Reproductive/Obstetrics                            Anesthesia Physical Anesthesia Plan  ASA: III  Anesthesia Plan: General   Post-op Pain Management:     Induction: Intravenous  PONV Risk Score and Plan: 3 and Treatment may vary due to age or medical condition and Ondansetron  Airway Management Planned: LMA  Additional Equipment: None  Intra-op Plan:   Post-operative Plan: Extubation in OR  Informed Consent: I have reviewed the patients History and Physical, chart, labs and discussed the procedure including the risks, benefits and alternatives for the proposed anesthesia with the patient or authorized representative who has indicated his/her understanding and acceptance.     Dental advisory given  Plan Discussed with: CRNA and Anesthesiologist  Anesthesia Plan Comments:       Anesthesia Quick Evaluation

## 2019-01-12 NOTE — Telephone Encounter (Signed)
Attempted to return call.  No fax was received on 01/11/2019 to this nurse/doctor.  Dr. Lovena Le and this nurse are off next week.  Dr. Lovena Le saw Pt recently and cleared for surgery.  See office note.  For specific pacemaker needs regarding surgery contact device clinic for assistance.

## 2019-01-12 NOTE — Progress Notes (Signed)
Anesthesia Chart Review   Case: [id number] Date/Time: 01/15/19 1100   Procedures:      CYSTOSCOPY WITH RETROGRADE PYELOGRAM, URETEROSCOPY AND STENT PLACEMENT (Bilateral ) - 2 HRS     HOLMIUM LASER APPLICATION (Bilateral )   Anesthesia type: General   Pre-op diagnosis: BILATERAL RENAL CALCULI   Location: [facility] PROCEDURE [facility] / [facility]   Surgeon: Cleon Gustin, MD      DISCUSSION:85 y.o. never smoker with h/o chronic systolic heart failure, CHB s/p pacemaker, s/p TAVR, bilateral renal calculi scheduled for above procedure 01/15/2019 with Dr. Nicolette Bang.    Pt last seen by cardiologist, Dr. Cristopher Peru, 01/02/2019.  Per his OV note, "Preoperative clearance-the patient is an acceptable surgical risk, despite her age.  She may proceed with surgery.  CHB-she is s/p AV node ablation and is asymptomatic s/p PPM insertion.  PPM- her [employer] PPM is working normally.  Chronic systolic heart failure -her symptoms are class 2.  She is encouraged to maintain a low sodium diet."  Anticipate pt can proceed with planned procedure barring acute status change.   VS: BP 123/67   Pulse 71   Temp 36.6 C   Resp 20   Ht 5\' 1"  (1.549 m)   Wt 59 kg   BMI 24.56 kg/m   PROVIDERS: Koirala, Dibas, MD is PCP   Cristopher Peru, MD is Cardiologist  LABS: Labs reviewed: Acceptable for surgery. (all labs ordered are listed, but only abnormal results are displayed)  Labs Reviewed  SURGICAL PCR SCREEN  BASIC METABOLIC PANEL  CBC     IMAGES: Chest Xray 01/01/2019 FINDINGS: Pacemaker in stable position. Prior cardiac valve replacement again noted. Heart size normal. Stable mild bilateral peribronchial cuffing. No acute infiltrate. No pleural effusion or pneumothorax. Diffuse thoracic cage osteopenia and kyphoscoliosis again noted. Stable midthoracic vertebral body compression fracture.  IMPRESSION: Cardiac pacer and stable position. Prior cardiac valve replacement. Heart size normal. No acute  pulmonary disease. Chest is stable from prior exam.  EKG: 01/02/2019 Rate 70 bpm Ventricular-paced rhythm  Biventricular pacemaker detected   CV: Echo 07/01/2015 Conclusions   Compared to previous study of 09/19/2014: No significant change.  Normal left ventricular size and systolic function. Normal left ventricular  wall thickness. Estimated EF is 55%.  No regional wall motion abnormalities identified. Moderate LV diastolic  dysfunction. Elevated LV filling pressure estimate.  Normal right ventricular size and systolic function. Pacing/ICD leads noted  in right atrium and ventricle.  Normal right atrial pressure estimate.  Prosthetic aortic valve (bioprosthetic stent valve, TAVR). Aortic valve peak  systolic velocity 287 cm/sec.  Estimated peak aortic gradient 12mmHg. Mean systolic gradient 86VEHM.  Mild mitral regurgitation identified. Mild tricuspid regurgitation  identified. RVSP at least 30-37mmHg. Possible pulmonary hypertension.  No pericardial effusion identified. Past Medical History:  Diagnosis Date  . Bowel obstruction ([place])   . Cancer ([place])    on face  . History of kidney stones   . Renal disorder    Kidney stone    Past Surgical History:  Procedure Laterality Date  . BACK SURGERY    . BREAST LUMPECTOMY    . FRACTURE SURGERY  2017   lt hip with rod  . GASTRIC BYPASS    . PACEMAKER IMPLANT      MEDICATIONS: . furosemide (LASIX) 20 MG tablet  . ibuprofen (ADVIL,MOTRIN) 800 MG tablet  . Lidocaine 4 % PTCH  . neomycin-bacitracin-polymyxin (NEOSPORIN) [id number] ointment  . oxyCODONE-acetaminophen (PERCOCET) 5-325 MG tablet  .  Polyethyl Glycol-Propyl Glycol (LUBRICANT EYE DROPS) 0.4-0.3 % SOLN  . promethazine (PHENERGAN) 25 MG tablet  . sucralfate (CARAFATE) 1 GM/10ML suspension  . tamsulosin (FLOMAX) 0.4 MG CAPS capsule  . warfarin (COUMADIN) 1 MG tablet  . warfarin (COUMADIN) 2 MG tablet  . warfarin (COUMADIN) 3 MG tablet   No current  facility-administered medications for this encounter.     Maia Plan [facility] Pre-Surgical Testing [phone] 01/12/19  12:07 PM

## 2019-01-15 ENCOUNTER — Encounter (HOSPITAL_COMMUNITY): Payer: Self-pay

## 2019-01-15 ENCOUNTER — Ambulatory Visit (HOSPITAL_COMMUNITY): Payer: Medicare Other | Admitting: Anesthesiology

## 2019-01-15 ENCOUNTER — Ambulatory Visit (HOSPITAL_COMMUNITY): Payer: Medicare Other | Admitting: Physician Assistant

## 2019-01-15 ENCOUNTER — Ambulatory Visit (HOSPITAL_COMMUNITY)
Admission: RE | Admit: 2019-01-15 | Discharge: 2019-01-15 | Disposition: A | Discharge status: Home / Self Care | Payer: Medicare Other | Attending: Urology | Admitting: Urology

## 2019-01-15 ENCOUNTER — Encounter (HOSPITAL_COMMUNITY)
Admission: RE | Disposition: A | Discharge status: Home / Self Care | Payer: Self-pay | Source: Home / Self Care | Attending: Urology

## 2019-01-15 ENCOUNTER — Ambulatory Visit (HOSPITAL_COMMUNITY): Payer: Medicare Other

## 2019-01-15 DIAGNOSIS — N200 Calculus of kidney: Secondary | ICD-10-CM | POA: Insufficient documentation

## 2019-01-15 DIAGNOSIS — K219 Gastro-esophageal reflux disease without esophagitis: Secondary | ICD-10-CM | POA: Diagnosis not present

## 2019-01-15 DIAGNOSIS — Z9884 Bariatric surgery status: Secondary | ICD-10-CM | POA: Insufficient documentation

## 2019-01-15 DIAGNOSIS — I081 Rheumatic disorders of both mitral and tricuspid valves: Secondary | ICD-10-CM | POA: Diagnosis not present

## 2019-01-15 DIAGNOSIS — Z950 Presence of cardiac pacemaker: Secondary | ICD-10-CM | POA: Insufficient documentation

## 2019-01-15 DIAGNOSIS — Z952 Presence of prosthetic heart valve: Secondary | ICD-10-CM | POA: Diagnosis not present

## 2019-01-15 DIAGNOSIS — Z87442 Personal history of urinary calculi: Secondary | ICD-10-CM | POA: Insufficient documentation

## 2019-01-15 DIAGNOSIS — I872 Venous insufficiency (chronic) (peripheral): Secondary | ICD-10-CM | POA: Diagnosis not present

## 2019-01-15 DIAGNOSIS — I5022 Chronic systolic (congestive) heart failure: Secondary | ICD-10-CM | POA: Diagnosis not present

## 2019-01-15 DIAGNOSIS — Z20828 Contact with and (suspected) exposure to other viral communicable diseases: Secondary | ICD-10-CM | POA: Diagnosis not present

## 2019-01-15 HISTORY — PX: CYSTOSCOPY WITH RETROGRADE PYELOGRAM, URETEROSCOPY AND STENT PLACEMENT: SHX5789

## 2019-01-15 SURGERY — CYSTOURETEROSCOPY, WITH RETROGRADE PYELOGRAM AND STENT INSERTION
Anesthesia: General | Site: Urethra | Laterality: Bilateral

## 2019-01-15 MED ORDER — ONDANSETRON HCL 4 MG/2ML IJ SOLN
INTRAMUSCULAR | Status: AC
  Filled 2019-01-15: qty 2

## 2019-01-15 MED ORDER — LIDOCAINE 2% (20 MG/ML) 5 ML SYRINGE
INTRAMUSCULAR | Status: AC
  Filled 2019-01-15: qty 5

## 2019-01-15 MED ORDER — TAMSULOSIN HCL 0.4 MG PO CAPS: 0.4000 mg | ORAL_CAPSULE | Freq: Every day | ORAL | 0 refills | Status: AC

## 2019-01-15 MED ORDER — FENTANYL CITRATE (PF) 100 MCG/2ML IJ SOLN
INTRAMUSCULAR | Status: DC | PRN
  Administered 2019-01-15: 25 ug via INTRAVENOUS

## 2019-01-15 MED ORDER — SODIUM CHLORIDE 0.9 % IR SOLN
Status: DC | PRN
  Administered 2019-01-15: 3000 mL via INTRAVESICAL

## 2019-01-15 MED ORDER — FENTANYL CITRATE (PF) 100 MCG/2ML IJ SOLN
INTRAMUSCULAR | Status: AC
  Filled 2019-01-15: qty 2

## 2019-01-15 MED ORDER — ONDANSETRON HCL 4 MG/2ML IJ SOLN
4.0000 mg | Freq: Once | INTRAMUSCULAR | Status: AC | PRN
  Administered 2019-01-15: 14:00:00 4 mg via INTRAVENOUS

## 2019-01-15 MED ORDER — LACTATED RINGERS IV SOLN
INTRAVENOUS | Status: DC
  Administered 2019-01-15: 10:00:00 via INTRAVENOUS

## 2019-01-15 MED ORDER — PROPOFOL 10 MG/ML IV BOLUS
INTRAVENOUS | Status: AC
  Filled 2019-01-15: qty 20

## 2019-01-15 MED ORDER — ONDANSETRON HCL 4 MG/2ML IJ SOLN
INTRAMUSCULAR | Status: DC | PRN
  Administered 2019-01-15: 4 mg via INTRAVENOUS

## 2019-01-15 MED ORDER — IOHEXOL 300 MG/ML  SOLN
INTRAMUSCULAR | Status: DC | PRN
  Administered 2019-01-15: 15 mL via URETHRAL

## 2019-01-15 MED ORDER — OXYCODONE-ACETAMINOPHEN 5-325 MG PO TABS: 1.0000 | ORAL_TABLET | Freq: Three times a day (TID) | ORAL | 0 refills | Status: AC | PRN

## 2019-01-15 MED ORDER — LIDOCAINE 2% (20 MG/ML) 5 ML SYRINGE
INTRAMUSCULAR | Status: DC | PRN
  Administered 2019-01-15: 60 mg via INTRAVENOUS

## 2019-01-15 MED ORDER — 0.9 % SODIUM CHLORIDE (POUR BTL) OPTIME
TOPICAL | Status: DC | PRN
  Administered 2019-01-15: 1000 mL

## 2019-01-15 MED ORDER — CEFAZOLIN SODIUM-DEXTROSE 2-4 GM/100ML-% IV SOLN
2.0000 g | INTRAVENOUS | Status: AC
  Administered 2019-01-15: 2 g via INTRAVENOUS
  Filled 2019-01-15: qty 100

## 2019-01-15 MED ORDER — PROPOFOL 10 MG/ML IV BOLUS
INTRAVENOUS | Status: DC | PRN
  Administered 2019-01-15: 50 mg via INTRAVENOUS
  Administered 2019-01-15: 100 mg via INTRAVENOUS
  Administered 2019-01-15: 30 mg via INTRAVENOUS

## 2019-01-15 MED ORDER — FENTANYL CITRATE (PF) 100 MCG/2ML IJ SOLN
25.0000 ug | INTRAMUSCULAR | Status: DC | PRN
  Administered 2019-01-15 (×2): 50 ug via INTRAVENOUS

## 2019-01-15 SURGICAL SUPPLY — 26 items
BAG URO CATCHER STRL LF (MISCELLANEOUS) ×3 IMPLANT
CATH INTERMIT  6FR 70CM (CATHETERS) ×3 IMPLANT
CLOTH BEACON ORANGE TIMEOUT ST (SAFETY) IMPLANT
COVER SURGICAL LIGHT HANDLE (MISCELLANEOUS) IMPLANT
COVER WAND RF STERILE (DRAPES) IMPLANT
EXTRACTOR STONE NITINOL NGAGE (UROLOGICAL SUPPLIES) ×3 IMPLANT
FIBER LASER FLEXIVA 1000 (UROLOGICAL SUPPLIES) IMPLANT
FIBER LASER FLEXIVA 365 (UROLOGICAL SUPPLIES) IMPLANT
FIBER LASER FLEXIVA 550 (UROLOGICAL SUPPLIES) IMPLANT
FIBER LASER TRAC TIP (UROLOGICAL SUPPLIES) IMPLANT
GLOVE BIO SURGEON STRL SZ8 (GLOVE) IMPLANT
GLOVE SURG SS PI 8.0 STRL IVOR (GLOVE) ×3 IMPLANT
GOWN STRL REUS W/TWL XL LVL3 (GOWN DISPOSABLE) ×3 IMPLANT
GUIDEWIRE ANG ZIPWIRE 038X150 (WIRE) ×3 IMPLANT
GUIDEWIRE STR DUAL SENSOR (WIRE) ×3 IMPLANT
IV NS 1000ML (IV SOLUTION)
IV NS 1000ML BAXH (IV SOLUTION) IMPLANT
KIT TURNOVER KIT A (KITS) IMPLANT
MANIFOLD NEPTUNE II (INSTRUMENTS) ×3 IMPLANT
PACK CYSTO (CUSTOM PROCEDURE TRAY) ×3 IMPLANT
SHEATH URETERAL 12FRX35CM (MISCELLANEOUS) IMPLANT
STENT URET 6FRX24 CONTOUR (STENTS) ×3 IMPLANT
STENT URET 6FRX26 CONTOUR (STENTS) IMPLANT
TUBE FEEDING 8FR 16IN STR KANG (MISCELLANEOUS) IMPLANT
TUBING CONNECTING 10 (TUBING) ×3 IMPLANT
TUBING UROLOGY SET (TUBING) ×3 IMPLANT

## 2019-01-15 NOTE — Op Note (Signed)
.  Preoperative diagnosis: bilateral renal calculi  Postoperative diagnosis: Same  Procedure: 1 cystoscopy 2. bilateralretrograde pyelography 3.  Intraoperative fluoroscopy, under one hour, with interpretation 4.  Bilateral ureteroscopic stone manipulation with basket and laser lithotripsy extraction 5.  bilateral 6 x 24 JJ stent placement  Attending: Rosie Fate  Anesthesia: General  Estimated blood loss: None  Drains: bilateral 6 x 24 JJ ureteral stent with tether  Specimens: stone for analysis  Antibiotics: ancef  Findings: bilateral mid and lower pole renal calculi. No hydronephrosis. No masses/lesions in the bladder. Ureteral orifices in normal anatomic location.  Indications: Patient is a 85 year old female with a history of bilateral renal calculi and bilateral flank pain. After discussing treatment options, they decided proceed with bilateral ureteroscopic stone manipulation.  Procedure her in detail: The patient was brought to the operating room and a brief timeout was done to ensure correct patient, correct procedure, correct site.  General anesthesia was administered patient was placed in dorsal lithotomy position.  Her genitalia was then prepped and draped in usual sterile fashion.  A rigid 4 French cystoscope was passed in the urethra and the bladder.  Bladder was inspected free masses or lesions.  the ureteral orifices were in the normal orthotopic locations. a 6 french ureteral catheter was then instilled into the left ureteral orifice.  a gentle retrograde was obtained and findings noted above. We then advanced a zipwire through the catheter and up to the renal pelvis.  we then removed the cystoscope and cannulated the left ureteral orifice with a semirigid ureteroscope.  We located no stone in the ureter. We then placed a sensor wire up to the renal pelvis. We removed the scope and advanced a the flexible ureteroscope up to the renal pelvis to perform nephroscopy. We  located calculi in the mid and lower poles which were removed with an NGage basket. Once the stone were removed we then removed we then placed a 6 x 24 double-j ureteral stent over the original zip wire. We then removed the wire and good coil was noted in the the renal pelvis under fluoroscopy and the bladder under direct vision.  We then turned out attention to the right side. a 6 french ureteral catheter was then instilled into the right ureteral orifice.  a gentle retrograde was obtained and findings noted above. We then advanced a zipwire through the catheter and up to the renal pelvis.  we then removed the cystoscope and cannulated the right ureteral orifice with a semirigid ureteroscope.  We located no stone in the ureter. We then placed a sensor wire up to the renal pelvis. We removed the scope and advanced a the flexible ureteroscope up to the renal pelvis to perform nephroscopy. We located a stone in the mid pole. Using a 200nm laser fiber the stone was fragmented and the fragments were rmeoved with an NGage basket. Once the stones were removed we then removed we then placed a 6 x 24 double-j ureteral stent over the original zip wire.  We then removed the wire and good coil was noted in the the renal pelvis under fluoroscopy and the bladder under direct vision.   the bladder was then drained and this concluded the procedure which was well tolerated by patient.  Complications: None  Condition: Stable, extubated, transferred to PACU  Plan: Patient is to be discharged home as to follow-up in 1 week for stent removal

## 2019-01-15 NOTE — Discharge Instructions (Signed)

## 2019-01-15 NOTE — Transfer of Care (Signed)
Immediate Anesthesia Transfer of Care Note  Patient: Robin Bennett  Procedure(s) Performed: CYSTOSCOPY WITH RETROGRADE PYELOGRAM, URETEROSCOPY AND STENT PLACEMENT (Bilateral Urethra)  Patient Location: PACU  Anesthesia Type:General  Level of Consciousness: awake, alert  and oriented  Airway & Oxygen Therapy: Patient Spontanous Breathing and Patient connected to face mask oxygen  Post-op Assessment: Report given to RN and Post -op Vital signs reviewed and stable  Post vital signs: Reviewed and stable  Last Vitals:  Vitals Value Taken Time  BP    Temp    Pulse 71 01/15/19 1306  Resp 8 01/15/19 1306  SpO2 100 % 01/15/19 1306  Vitals shown include unvalidated device data.  Last Pain:  Vitals:   01/15/19 0955  TempSrc:   PainSc: 0-No pain         Complications: No apparent anesthesia complications

## 2019-01-15 NOTE — Anesthesia Procedure Notes (Signed)
Procedure Name: Intubation Date/Time: 01/15/2019 11:40 AM Performed by: Niel Hummer, CRNA Pre-anesthesia Checklist: Patient identified, Emergency Drugs available, Suction available and Patient being monitored Patient Re-evaluated:Patient Re-evaluated prior to induction Oxygen Delivery Method: Circle system utilized Preoxygenation: Pre-oxygenation with 100% oxygen Induction Type: IV induction Ventilation: Mask ventilation without difficulty Laryngoscope Size: Mac and 4 Grade View: Grade I Tube type: Oral Tube size: 7.0 mm Number of attempts: 1 Airway Equipment and Method: Stylet Placement Confirmation: ETT inserted through vocal cords under direct vision,  positive ETCO2 and breath sounds checked- equal and bilateral Secured at: 22 cm Tube secured with: Tape Dental Injury: Teeth and Oropharynx as per pre-operative assessment  Comments: LMA 3 placed initially with leak. Removed LMA and placed ETT. No complications noted.

## 2019-01-15 NOTE — Anesthesia Postprocedure Evaluation (Signed)
Anesthesia Post Note  Patient: Robin Bennett  Procedure(s) Performed: CYSTOSCOPY WITH RETROGRADE PYELOGRAM, URETEROSCOPY AND STENT PLACEMENT (Bilateral Urethra)     Patient location during evaluation: PACU Anesthesia Type: General Level of consciousness: awake and alert Pain management: pain level controlled Vital Signs Assessment: post-procedure vital signs reviewed and stable Respiratory status: spontaneous breathing, nonlabored ventilation, respiratory function stable and patient connected to nasal cannula oxygen Cardiovascular status: blood pressure returned to baseline and stable Postop Assessment: no apparent nausea or vomiting Anesthetic complications: no    Last Vitals:  Vitals:   01/15/19 0900 01/15/19 1306  BP: 134/84 130/75  Pulse: 72 71  Resp: 16 (!) 8  Temp: 36.9 C (!) 36.4 C  SpO2: 100% 100%    Last Pain:  Vitals:   01/15/19 1306  TempSrc:   PainSc: 0-No pain                 Sanjeev Main S

## 2019-01-15 NOTE — H&P (Signed)
Urology Admission H&P  Chief Complaint: bilateral renal calculi  History of Present Illness: Ms Appleman is a 85yo with a hx of nephrolithiasis who has bilateral renal calculi. He has intermittent flank pain. NO LUTS. No hematuria.  Past Medical History:  Diagnosis Date  . Bowel obstruction ([place])   . Cancer ([place])    on face  . CHF (congestive heart failure) ([facility])    Chronic systolic heart failure  . History of kidney stones   . Presence of permanent cardiac pacemaker 08/2013   CHB complete Heart block pacemaker  . Renal disorder    Kidney stone   Past Surgical History:  Procedure Laterality Date  . BACK SURGERY    . BREAST LUMPECTOMY    . CARDIAC VALVE REPLACEMENT  2016   Transcath Aortic Valve replacement  . FRACTURE SURGERY  2017   lt hip with rod  . GASTRIC BYPASS    . PACEMAKER IMPLANT      Home Medications:  Current Facility-Administered Medications  Medication Dose Route Frequency Provider Last Rate Last Dose  . ceFAZolin (ANCEF) IVPB 2g/100 mL premix  2 g Intravenous 30 min Pre-Op McKenzie, Candee Furbish, MD      . lactated ringers infusion   Intravenous Continuous Audry Pili, MD 10 mL/hr at 01/15/19 1004     Allergies:  Allergies  Allergen Reactions  . Codeine Other (See Comments)    Ineffective for patient  . Latex Hives  . Morphine And Related Other (See Comments)    Ineffective for patient     Family History  Problem Relation Age of Onset  . Heart attack Father    Social History:  reports that she has never smoked. She has never used smokeless tobacco. She reports previous alcohol use. She reports previous drug use.  Review of Systems  All other systems reviewed and are negative.   Physical Exam:  Vital signs in last 24 hours: Temp:  [98.4 F (36.9 C)] 98.4 F (36.9 C) (08/10 0900) Pulse Rate:  [72] 72 (08/10 0900) Resp:  [16] 16 (08/10 0900) BP: (134)/(84) 134/84 (08/10 0900) SpO2:  [100 %] 100 % (08/10 0900) Weight:  [59 kg] 59 kg (08/10  0900) Physical Exam  Constitutional: She is oriented to person, place, and time. She appears well-developed and well-nourished.  HENT:  Head: Normocephalic and atraumatic.  Eyes: Pupils are equal, round, and reactive to light. EOM are normal.  Neck: Normal range of motion. No thyromegaly present.  Cardiovascular: Normal rate and regular rhythm.  Respiratory: Effort normal. No respiratory distress.  GI: Soft. She exhibits no distension.  Musculoskeletal: Normal range of motion.        General: No edema.  Neurological: She is alert and oriented to person, place, and time.  Skin: Skin is warm and dry.  Psychiatric: She has a normal mood and affect. Her behavior is normal. Judgment and thought content normal.    Laboratory Data:  No results found for this or any previous visit (from the past 24 hour(s)). Recent Results (from the past 240 hour(s))  Surgical pcr screen     Status: None   Collection Time: 01/11/19 11:27 AM   Specimen: Nasal Mucosa; Nasal Swab  Result Value Ref Range Status   MRSA, PCR NEGATIVE NEGATIVE Final   Staphylococcus aureus NEGATIVE NEGATIVE Final    Comment: (NOTE) The Xpert SA Assay (FDA approved for NASAL specimens in patients 56 years of age and older), is one component of a comprehensive surveillance program. It  is not intended to diagnose infection nor to guide or monitor treatment. Performed at [facility], [place] [address] [place], [place] [address]   SARS CORONAVIRUS 2 Nasal Swab Aptima Multi Swab     Status: None   Collection Time: 01/11/19  1:05 PM   Specimen: Aptima Multi Swab; Nasal Swab  Result Value Ref Range Status   SARS Coronavirus 2 NEGATIVE NEGATIVE Final    Comment: (NOTE) SARS-CoV-2 target nucleic acids are NOT DETECTED. The SARS-CoV-2 RNA is generally detectable in upper and lower respiratory specimens during the acute phase of infection. Negative results do not preclude SARS-CoV-2 infection, do not rule  out co-infections with other pathogens, and should not be used as the sole basis for treatment or other patient management decisions. Negative results must be combined with clinical observations, patient history, and epidemiological information. The expected result is Negative. Fact Sheet for Patients: SugarRoll.be Fact Sheet for Healthcare Providers: https://www.woods-mathews.com/ This test is not yet approved or cleared by the [place] FDA and  has been authorized for detection and/or diagnosis of SARS-CoV-2 by FDA under an Emergency Use Authorization (EUA). This EUA will remain  in effect (meaning this test can be used) for the duration of the COVID-19 declaration under Section 56 4(b)(1) of the Act, 21 U.S.C. section 360bbb-3(b)(1), unless the authorization is terminated or revoked sooner. Performed at [facility], [place] [address], [place], [place] [address]    Creatinine: Recent Labs    01/11/19 1127  CREATININE 0.86   Baseline Creatinine: 0.86  Impression/Assessment:  85yo with bilateral renal calculi  Plan:  The risks/benefits/alternatives to bilateral ureteroscopic stone extraction was explained to the patient and he understands and wishes to proceed with surgery  Nicolette Bang 01/15/2019, 11:19 AM

## 2019-01-16 ENCOUNTER — Encounter (HOSPITAL_COMMUNITY): Payer: Self-pay | Admitting: Urology

## 2019-01-19 DIAGNOSIS — Z7901 Long term (current) use of anticoagulants: Secondary | ICD-10-CM | POA: Diagnosis not present

## 2019-01-19 LAB — CUP PACEART INCLINIC DEVICE CHECK
Date Time Interrogation Session: 20200814154440
Implantable Lead Implant Date: 20150320
Implantable Lead Implant Date: 20150320
Implantable Lead Location: 753858
Implantable Lead Location: 753860
Implantable Lead Model: 4136
Implantable Lead Serial Number: 29610206
Implantable Pulse Generator Implant Date: 20150320
Pulse Gen Serial Number: 101298

## 2019-01-22 DIAGNOSIS — N202 Calculus of kidney with calculus of ureter: Secondary | ICD-10-CM | POA: Diagnosis not present

## 2019-02-02 DIAGNOSIS — Z7901 Long term (current) use of anticoagulants: Secondary | ICD-10-CM | POA: Diagnosis not present

## 2019-02-15 DIAGNOSIS — Z23 Encounter for immunization: Secondary | ICD-10-CM | POA: Diagnosis not present

## 2019-02-15 DIAGNOSIS — Z7901 Long term (current) use of anticoagulants: Secondary | ICD-10-CM | POA: Diagnosis not present

## 2019-02-23 DIAGNOSIS — Z7901 Long term (current) use of anticoagulants: Secondary | ICD-10-CM | POA: Diagnosis not present

## 2019-02-23 DIAGNOSIS — N202 Calculus of kidney with calculus of ureter: Secondary | ICD-10-CM | POA: Diagnosis not present

## 2019-02-26 DIAGNOSIS — K219 Gastro-esophageal reflux disease without esophagitis: Secondary | ICD-10-CM | POA: Diagnosis not present

## 2019-02-26 DIAGNOSIS — I4891 Unspecified atrial fibrillation: Secondary | ICD-10-CM | POA: Diagnosis not present

## 2019-02-26 DIAGNOSIS — Z7901 Long term (current) use of anticoagulants: Secondary | ICD-10-CM | POA: Diagnosis not present

## 2019-02-26 DIAGNOSIS — L989 Disorder of the skin and subcutaneous tissue, unspecified: Secondary | ICD-10-CM | POA: Diagnosis not present

## 2019-03-09 DIAGNOSIS — Z7901 Long term (current) use of anticoagulants: Secondary | ICD-10-CM | POA: Diagnosis not present

## 2019-03-14 ENCOUNTER — Ambulatory Visit (INDEPENDENT_AMBULATORY_CARE_PROVIDER_SITE_OTHER): Payer: Medicare Other | Admitting: *Deleted

## 2019-03-14 DIAGNOSIS — I442 Atrioventricular block, complete: Secondary | ICD-10-CM

## 2019-03-14 DIAGNOSIS — Z7901 Long term (current) use of anticoagulants: Secondary | ICD-10-CM | POA: Diagnosis not present

## 2019-03-14 DIAGNOSIS — I5022 Chronic systolic (congestive) heart failure: Secondary | ICD-10-CM

## 2019-03-15 LAB — CUP PACEART REMOTE DEVICE CHECK
Battery Remaining Longevity: 90 mo
Battery Remaining Longevity: 90 mo
Battery Remaining Percentage: 96 %
Battery Remaining Percentage: 96 %
Brady Statistic RA Percent Paced: 0 %
Brady Statistic RA Percent Paced: 0 %
Brady Statistic RV Percent Paced: 98 %
Brady Statistic RV Percent Paced: 98 %
Date Time Interrogation Session: 20201007074200
Date Time Interrogation Session: 20201007134500
Implantable Lead Implant Date: 20150320
Implantable Lead Implant Date: 20150320
Implantable Lead Implant Date: 20150320
Implantable Lead Implant Date: 20150320
Implantable Lead Location: 753858
Implantable Lead Location: 753860
Implantable Lead Location: 753858
Implantable Lead Location: 753860
Implantable Lead Model: 4136
Implantable Lead Model: 4136
Implantable Lead Serial Number: 29610206
Implantable Lead Serial Number: 29610206
Implantable Pulse Generator Implant Date: 20150320
Implantable Pulse Generator Implant Date: 20150320
Lead Channel Impedance Value: 1524 Ohm
Lead Channel Impedance Value: 817 Ohm
Lead Channel Impedance Value: 1524 Ohm
Lead Channel Impedance Value: 817 Ohm
Lead Channel Pacing Threshold Amplitude: 0.6 V
Lead Channel Pacing Threshold Amplitude: 1.3 V
Lead Channel Pacing Threshold Amplitude: 0.6 V
Lead Channel Pacing Threshold Amplitude: 1.3 V
Lead Channel Pacing Threshold Pulse Width: 0.4 ms
Lead Channel Pacing Threshold Pulse Width: 1 ms
Lead Channel Pacing Threshold Pulse Width: 0.4 ms
Lead Channel Pacing Threshold Pulse Width: 1 ms
Lead Channel Setting Pacing Amplitude: 2 V
Lead Channel Setting Pacing Amplitude: 2.5 V
Lead Channel Setting Pacing Amplitude: 2 V
Lead Channel Setting Pacing Amplitude: 2.5 V
Lead Channel Setting Pacing Pulse Width: 0.4 ms
Lead Channel Setting Pacing Pulse Width: 1 ms
Lead Channel Setting Pacing Pulse Width: 0.4 ms
Lead Channel Setting Pacing Pulse Width: 1 ms
Lead Channel Setting Sensing Sensitivity: 2.5 mV
Lead Channel Setting Sensing Sensitivity: 2.5 mV
Lead Channel Setting Sensing Sensitivity: 2.5 mV
Lead Channel Setting Sensing Sensitivity: 2.5 mV
Pulse Gen Serial Number: 101298
Pulse Gen Serial Number: 101298

## 2019-03-21 DIAGNOSIS — Z7901 Long term (current) use of anticoagulants: Secondary | ICD-10-CM | POA: Diagnosis not present

## 2019-03-23 NOTE — Progress Notes (Signed)
Remote pacemaker transmission.   

## 2019-03-26 DIAGNOSIS — Z7901 Long term (current) use of anticoagulants: Secondary | ICD-10-CM | POA: Diagnosis not present

## 2019-03-30 DIAGNOSIS — I4891 Unspecified atrial fibrillation: Secondary | ICD-10-CM | POA: Diagnosis not present

## 2019-03-30 DIAGNOSIS — Z7901 Long term (current) use of anticoagulants: Secondary | ICD-10-CM | POA: Diagnosis not present

## 2019-05-02 DIAGNOSIS — Z85828 Personal history of other malignant neoplasm of skin: Secondary | ICD-10-CM | POA: Diagnosis not present

## 2019-05-02 DIAGNOSIS — L570 Actinic keratosis: Secondary | ICD-10-CM | POA: Diagnosis not present

## 2019-05-02 DIAGNOSIS — L821 Other seborrheic keratosis: Secondary | ICD-10-CM | POA: Diagnosis not present

## 2019-05-02 DIAGNOSIS — D485 Neoplasm of uncertain behavior of skin: Secondary | ICD-10-CM | POA: Diagnosis not present

## 2019-05-09 DIAGNOSIS — I480 Paroxysmal atrial fibrillation: Secondary | ICD-10-CM | POA: Diagnosis not present

## 2019-05-09 DIAGNOSIS — C44329 Squamous cell carcinoma of skin of other parts of face: Secondary | ICD-10-CM | POA: Diagnosis not present

## 2019-05-09 DIAGNOSIS — D508 Other iron deficiency anemias: Secondary | ICD-10-CM | POA: Diagnosis not present

## 2019-05-24 DIAGNOSIS — L570 Actinic keratosis: Secondary | ICD-10-CM | POA: Diagnosis not present

## 2019-05-24 DIAGNOSIS — C4442 Squamous cell carcinoma of skin of scalp and neck: Secondary | ICD-10-CM | POA: Diagnosis not present

## 2019-06-13 ENCOUNTER — Ambulatory Visit (INDEPENDENT_AMBULATORY_CARE_PROVIDER_SITE_OTHER): Payer: Medicare Other | Admitting: *Deleted

## 2019-06-13 DIAGNOSIS — I442 Atrioventricular block, complete: Secondary | ICD-10-CM | POA: Diagnosis not present

## 2019-06-13 LAB — CUP PACEART REMOTE DEVICE CHECK
Battery Remaining Longevity: 84 mo
Battery Remaining Percentage: 93 %
Brady Statistic RA Percent Paced: 0 %
Brady Statistic RV Percent Paced: 98 %
Date Time Interrogation Session: 20210106034100
Implantable Lead Implant Date: 20150320
Implantable Lead Implant Date: 20150320
Implantable Lead Location: 753858
Implantable Lead Location: 753860
Implantable Lead Model: 4136
Implantable Lead Serial Number: 29610206
Implantable Pulse Generator Implant Date: 20150320
Lead Channel Impedance Value: 1448 Ohm
Lead Channel Impedance Value: 761 Ohm
Lead Channel Pacing Threshold Amplitude: 0.6 V
Lead Channel Pacing Threshold Amplitude: 1.3 V
Lead Channel Pacing Threshold Pulse Width: 0.4 ms
Lead Channel Pacing Threshold Pulse Width: 1 ms
Lead Channel Setting Pacing Amplitude: 2 V
Lead Channel Setting Pacing Amplitude: 2.5 V
Lead Channel Setting Pacing Pulse Width: 0.4 ms
Lead Channel Setting Pacing Pulse Width: 1 ms
Lead Channel Setting Sensing Sensitivity: 2.5 mV
Lead Channel Setting Sensing Sensitivity: 2.5 mV
Pulse Gen Serial Number: 101298

## 2019-06-18 DIAGNOSIS — Z23 Encounter for immunization: Secondary | ICD-10-CM | POA: Diagnosis not present

## 2019-06-27 DIAGNOSIS — Z953 Presence of xenogenic heart valve: Secondary | ICD-10-CM | POA: Diagnosis not present

## 2019-06-27 DIAGNOSIS — I480 Paroxysmal atrial fibrillation: Secondary | ICD-10-CM | POA: Diagnosis not present

## 2019-06-27 DIAGNOSIS — R0600 Dyspnea, unspecified: Secondary | ICD-10-CM | POA: Diagnosis not present

## 2019-06-27 DIAGNOSIS — D508 Other iron deficiency anemias: Secondary | ICD-10-CM | POA: Diagnosis not present

## 2019-06-27 DIAGNOSIS — R42 Dizziness and giddiness: Secondary | ICD-10-CM | POA: Diagnosis not present

## 2019-07-04 DIAGNOSIS — I361 Nonrheumatic tricuspid (valve) insufficiency: Secondary | ICD-10-CM | POA: Diagnosis not present

## 2019-07-04 DIAGNOSIS — R42 Dizziness and giddiness: Secondary | ICD-10-CM | POA: Diagnosis not present

## 2019-07-04 DIAGNOSIS — I340 Nonrheumatic mitral (valve) insufficiency: Secondary | ICD-10-CM | POA: Diagnosis not present

## 2019-07-04 DIAGNOSIS — R0600 Dyspnea, unspecified: Secondary | ICD-10-CM | POA: Diagnosis not present

## 2019-07-04 DIAGNOSIS — Z953 Presence of xenogenic heart valve: Secondary | ICD-10-CM | POA: Diagnosis not present

## 2019-07-16 DIAGNOSIS — Z23 Encounter for immunization: Secondary | ICD-10-CM | POA: Diagnosis not present

## 2019-08-13 DIAGNOSIS — Z6825 Body mass index (BMI) 25.0-25.9, adult: Secondary | ICD-10-CM | POA: Diagnosis not present

## 2019-08-13 DIAGNOSIS — K219 Gastro-esophageal reflux disease without esophagitis: Secondary | ICD-10-CM | POA: Diagnosis not present

## 2019-08-13 DIAGNOSIS — D508 Other iron deficiency anemias: Secondary | ICD-10-CM | POA: Diagnosis not present

## 2019-08-13 DIAGNOSIS — E7800 Pure hypercholesterolemia, unspecified: Secondary | ICD-10-CM | POA: Diagnosis not present

## 2019-08-13 DIAGNOSIS — I480 Paroxysmal atrial fibrillation: Secondary | ICD-10-CM | POA: Diagnosis not present

## 2019-08-27 ENCOUNTER — Other Ambulatory Visit: Payer: Self-pay | Admitting: Family Medicine

## 2019-08-27 DIAGNOSIS — L570 Actinic keratosis: Secondary | ICD-10-CM | POA: Diagnosis not present

## 2019-08-27 DIAGNOSIS — R911 Solitary pulmonary nodule: Secondary | ICD-10-CM

## 2019-08-27 DIAGNOSIS — C44529 Squamous cell carcinoma of skin of other part of trunk: Secondary | ICD-10-CM | POA: Diagnosis not present

## 2019-08-27 DIAGNOSIS — D0439 Carcinoma in situ of skin of other parts of face: Secondary | ICD-10-CM | POA: Diagnosis not present

## 2019-08-27 DIAGNOSIS — D485 Neoplasm of uncertain behavior of skin: Secondary | ICD-10-CM | POA: Diagnosis not present

## 2019-09-13 DIAGNOSIS — D0439 Carcinoma in situ of skin of other parts of face: Secondary | ICD-10-CM | POA: Diagnosis not present

## 2019-10-01 DIAGNOSIS — C44529 Squamous cell carcinoma of skin of other part of trunk: Secondary | ICD-10-CM | POA: Diagnosis not present

## 2019-10-23 IMAGING — DX DG TIBIA/FIBULA 2V*L*
4 series · 4 of 4 positions shown · non-contrast
Comparison: No prior.

CLINICAL DATA: Left lower leg wound.

EXAM:
LEFT TIBIA AND FIBULA - 2 VIEW

[tibia ap (1 of 2)]
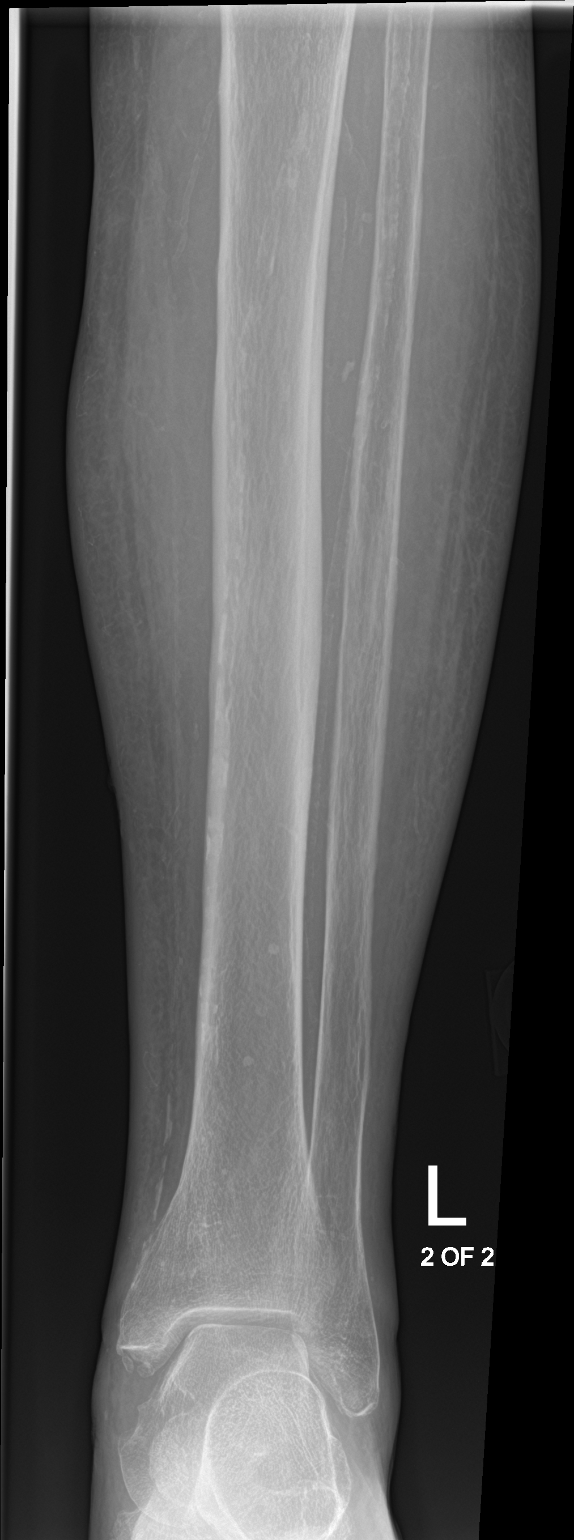

[tibia ap (2 of 2)]
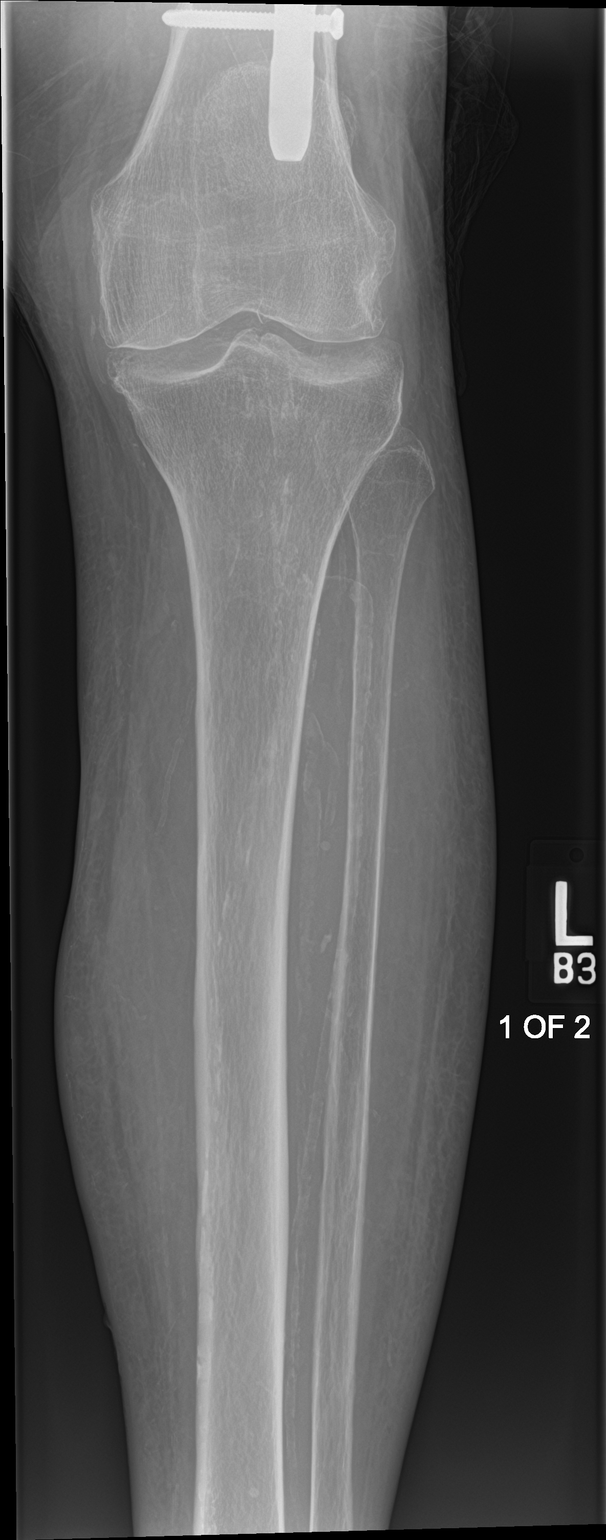

[tibia lat (1 of 2)]
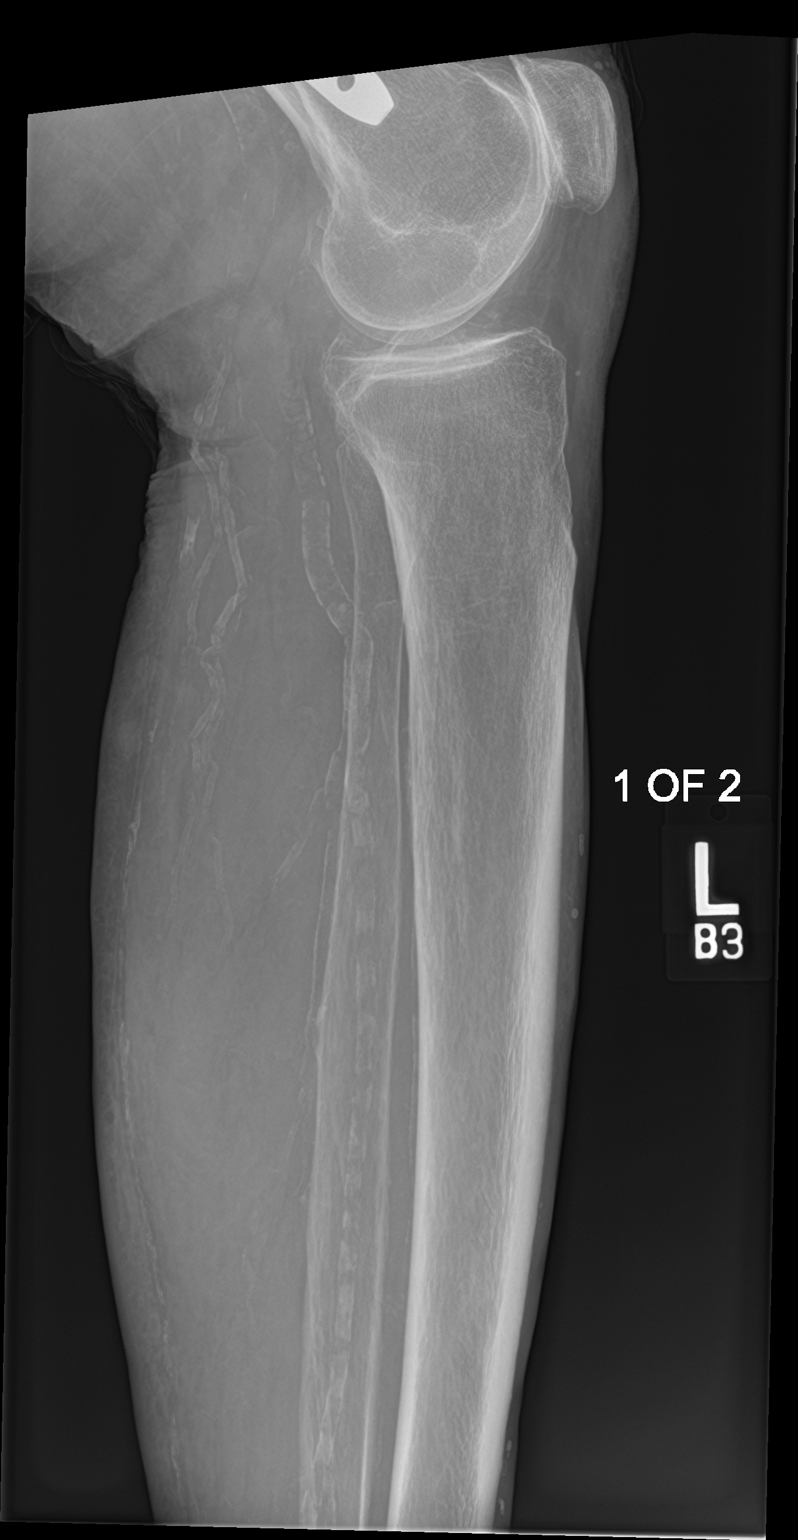

[tibia lat (2 of 2)]
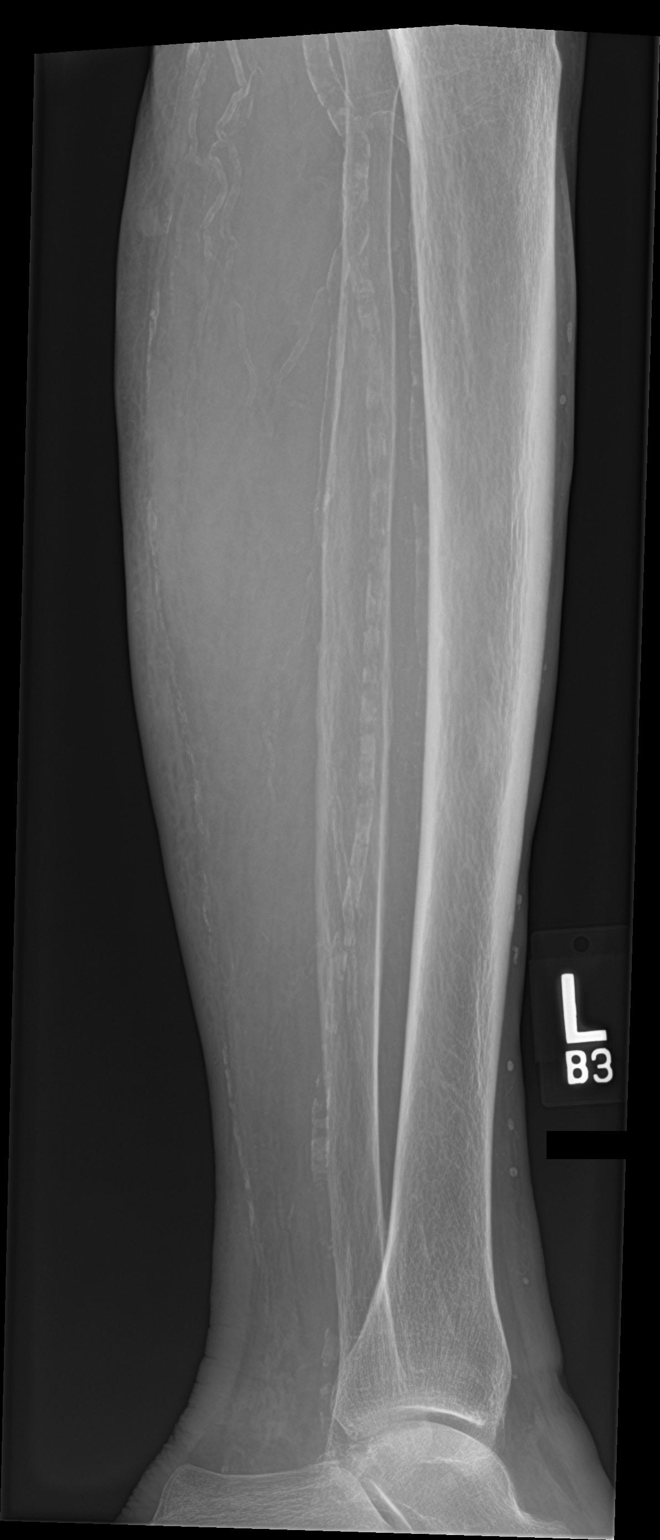

[4 of 4 positions shown; findings below may reference images not displayed]

FINDINGS: Postsurgical changes distal femur. Diffuse osteopenia. Degenerative
changes left knee and left ankle. Small corticated bony densities
noted adjacent to the medial malleolus consistent old avulsion
fractures. No acute abnormality identified. Peripheral vascular
calcification.
IMPRESSION: 1.  Postsurgical changes left femur.

2. Diffuse osteopenia and degenerative change. Small corticated bony
densities noted adjacent to the medial malleolus consistent with old
avulsion fractures. No acute bony abnormality.

3.  Peripheral vascular disease.

## 2019-10-31 DIAGNOSIS — K219 Gastro-esophageal reflux disease without esophagitis: Secondary | ICD-10-CM | POA: Diagnosis not present

## 2019-10-31 DIAGNOSIS — N200 Calculus of kidney: Secondary | ICD-10-CM | POA: Diagnosis not present

## 2019-12-24 DIAGNOSIS — I480 Paroxysmal atrial fibrillation: Secondary | ICD-10-CM | POA: Diagnosis not present

## 2019-12-24 DIAGNOSIS — Z6825 Body mass index (BMI) 25.0-25.9, adult: Secondary | ICD-10-CM | POA: Diagnosis not present

## 2019-12-24 DIAGNOSIS — K219 Gastro-esophageal reflux disease without esophagitis: Secondary | ICD-10-CM | POA: Diagnosis not present

## 2019-12-25 DIAGNOSIS — Z953 Presence of xenogenic heart valve: Secondary | ICD-10-CM | POA: Diagnosis not present

## 2019-12-25 DIAGNOSIS — I480 Paroxysmal atrial fibrillation: Secondary | ICD-10-CM | POA: Diagnosis not present

## 2019-12-25 DIAGNOSIS — Z950 Presence of cardiac pacemaker: Secondary | ICD-10-CM | POA: Diagnosis not present

## 2019-12-25 DIAGNOSIS — I4811 Longstanding persistent atrial fibrillation: Secondary | ICD-10-CM | POA: Diagnosis not present

## 2019-12-31 DIAGNOSIS — Z85828 Personal history of other malignant neoplasm of skin: Secondary | ICD-10-CM | POA: Diagnosis not present

## 2019-12-31 DIAGNOSIS — D485 Neoplasm of uncertain behavior of skin: Secondary | ICD-10-CM | POA: Diagnosis not present

## 2019-12-31 DIAGNOSIS — L821 Other seborrheic keratosis: Secondary | ICD-10-CM | POA: Diagnosis not present

## 2019-12-31 DIAGNOSIS — L814 Other melanin hyperpigmentation: Secondary | ICD-10-CM | POA: Diagnosis not present

## 2019-12-31 DIAGNOSIS — L570 Actinic keratosis: Secondary | ICD-10-CM | POA: Diagnosis not present

## 2019-12-31 DIAGNOSIS — Z08 Encounter for follow-up examination after completed treatment for malignant neoplasm: Secondary | ICD-10-CM | POA: Diagnosis not present

## 2019-12-31 DIAGNOSIS — D0462 Carcinoma in situ of skin of left upper limb, including shoulder: Secondary | ICD-10-CM | POA: Diagnosis not present

## 2020-01-02 DIAGNOSIS — I4811 Longstanding persistent atrial fibrillation: Secondary | ICD-10-CM | POA: Diagnosis not present

## 2020-01-10 DIAGNOSIS — Z45010 Encounter for checking and testing of cardiac pacemaker pulse generator [battery]: Secondary | ICD-10-CM | POA: Diagnosis not present

## 2020-02-18 DIAGNOSIS — Z23 Encounter for immunization: Secondary | ICD-10-CM | POA: Diagnosis not present

## 2020-03-06 DIAGNOSIS — Z7189 Other specified counseling: Secondary | ICD-10-CM | POA: Diagnosis not present

## 2020-03-06 DIAGNOSIS — D485 Neoplasm of uncertain behavior of skin: Secondary | ICD-10-CM | POA: Diagnosis not present

## 2020-03-06 DIAGNOSIS — D0462 Carcinoma in situ of skin of left upper limb, including shoulder: Secondary | ICD-10-CM | POA: Diagnosis not present

## 2020-03-06 DIAGNOSIS — L820 Inflamed seborrheic keratosis: Secondary | ICD-10-CM | POA: Diagnosis not present

## 2020-03-25 DIAGNOSIS — R42 Dizziness and giddiness: Secondary | ICD-10-CM | POA: Diagnosis not present

## 2020-04-03 DIAGNOSIS — M79674 Pain in right toe(s): Secondary | ICD-10-CM | POA: Diagnosis not present

## 2020-04-07 DIAGNOSIS — I6523 Occlusion and stenosis of bilateral carotid arteries: Secondary | ICD-10-CM | POA: Diagnosis not present

## 2020-04-07 DIAGNOSIS — R42 Dizziness and giddiness: Secondary | ICD-10-CM | POA: Diagnosis not present

## 2020-04-08 DIAGNOSIS — H903 Sensorineural hearing loss, bilateral: Secondary | ICD-10-CM | POA: Diagnosis not present

## 2020-04-22 DIAGNOSIS — H903 Sensorineural hearing loss, bilateral: Secondary | ICD-10-CM | POA: Diagnosis not present

## 2020-04-24 DIAGNOSIS — Z6825 Body mass index (BMI) 25.0-25.9, adult: Secondary | ICD-10-CM | POA: Diagnosis not present

## 2020-04-24 DIAGNOSIS — I4811 Longstanding persistent atrial fibrillation: Secondary | ICD-10-CM | POA: Diagnosis not present

## 2020-04-24 DIAGNOSIS — K219 Gastro-esophageal reflux disease without esophagitis: Secondary | ICD-10-CM | POA: Diagnosis not present

## 2020-04-24 DIAGNOSIS — M79674 Pain in right toe(s): Secondary | ICD-10-CM | POA: Diagnosis not present

## 2020-05-08 DIAGNOSIS — N390 Urinary tract infection, site not specified: Secondary | ICD-10-CM | POA: Diagnosis not present

## 2020-05-08 DIAGNOSIS — N200 Calculus of kidney: Secondary | ICD-10-CM | POA: Diagnosis not present

## 2020-05-10 DIAGNOSIS — M79675 Pain in left toe(s): Secondary | ICD-10-CM | POA: Diagnosis not present

## 2020-05-10 DIAGNOSIS — Z950 Presence of cardiac pacemaker: Secondary | ICD-10-CM | POA: Diagnosis not present

## 2020-05-10 DIAGNOSIS — M79674 Pain in right toe(s): Secondary | ICD-10-CM | POA: Diagnosis not present

## 2020-05-10 DIAGNOSIS — Z7901 Long term (current) use of anticoagulants: Secondary | ICD-10-CM | POA: Diagnosis not present

## 2020-05-10 DIAGNOSIS — Z952 Presence of prosthetic heart valve: Secondary | ICD-10-CM | POA: Diagnosis not present

## 2020-05-10 DIAGNOSIS — S90415A Abrasion, left lesser toe(s), initial encounter: Secondary | ICD-10-CM | POA: Diagnosis not present

## 2020-05-10 DIAGNOSIS — S90122A Contusion of left lesser toe(s) without damage to nail, initial encounter: Secondary | ICD-10-CM | POA: Diagnosis not present

## 2020-05-15 DIAGNOSIS — I75021 Atheroembolism of right lower extremity: Secondary | ICD-10-CM | POA: Diagnosis not present

## 2020-05-15 DIAGNOSIS — I70201 Unspecified atherosclerosis of native arteries of extremities, right leg: Secondary | ICD-10-CM | POA: Diagnosis not present

## 2020-05-15 DIAGNOSIS — I739 Peripheral vascular disease, unspecified: Secondary | ICD-10-CM | POA: Diagnosis not present

## 2020-05-23 DIAGNOSIS — N200 Calculus of kidney: Secondary | ICD-10-CM | POA: Diagnosis not present

## 2020-05-23 DIAGNOSIS — I70203 Unspecified atherosclerosis of native arteries of extremities, bilateral legs: Secondary | ICD-10-CM | POA: Diagnosis not present

## 2020-05-23 DIAGNOSIS — K551 Chronic vascular disorders of intestine: Secondary | ICD-10-CM | POA: Diagnosis not present

## 2020-05-23 DIAGNOSIS — I75029 Atheroembolism of unspecified lower extremity: Secondary | ICD-10-CM | POA: Diagnosis not present

## 2020-05-23 DIAGNOSIS — I739 Peripheral vascular disease, unspecified: Secondary | ICD-10-CM | POA: Diagnosis not present

## 2020-05-23 DIAGNOSIS — I701 Atherosclerosis of renal artery: Secondary | ICD-10-CM | POA: Diagnosis not present

## 2020-06-02 DIAGNOSIS — I96 Gangrene, not elsewhere classified: Secondary | ICD-10-CM | POA: Diagnosis not present

## 2020-06-02 DIAGNOSIS — L97511 Non-pressure chronic ulcer of other part of right foot limited to breakdown of skin: Secondary | ICD-10-CM | POA: Diagnosis not present

## 2020-06-02 DIAGNOSIS — I739 Peripheral vascular disease, unspecified: Secondary | ICD-10-CM | POA: Diagnosis not present

## 2020-06-02 DIAGNOSIS — I7389 Other specified peripheral vascular diseases: Secondary | ICD-10-CM | POA: Diagnosis not present

## 2020-06-02 DIAGNOSIS — Z953 Presence of xenogenic heart valve: Secondary | ICD-10-CM | POA: Diagnosis not present

## 2020-06-02 DIAGNOSIS — Z7901 Long term (current) use of anticoagulants: Secondary | ICD-10-CM | POA: Diagnosis not present

## 2020-06-02 DIAGNOSIS — Z9884 Bariatric surgery status: Secondary | ICD-10-CM | POA: Diagnosis not present

## 2020-06-02 DIAGNOSIS — E7800 Pure hypercholesterolemia, unspecified: Secondary | ICD-10-CM | POA: Diagnosis not present

## 2020-06-02 DIAGNOSIS — I4811 Longstanding persistent atrial fibrillation: Secondary | ICD-10-CM | POA: Diagnosis not present

## 2020-06-02 DIAGNOSIS — I75021 Atheroembolism of right lower extremity: Secondary | ICD-10-CM | POA: Diagnosis not present

## 2020-06-02 DIAGNOSIS — Z950 Presence of cardiac pacemaker: Secondary | ICD-10-CM | POA: Diagnosis not present

## 2020-06-02 DIAGNOSIS — H903 Sensorineural hearing loss, bilateral: Secondary | ICD-10-CM | POA: Diagnosis not present

## 2020-06-02 DIAGNOSIS — M79674 Pain in right toe(s): Secondary | ICD-10-CM | POA: Diagnosis not present

## 2020-06-02 DIAGNOSIS — M868X7 Other osteomyelitis, ankle and foot: Secondary | ICD-10-CM | POA: Diagnosis not present

## 2020-06-03 DIAGNOSIS — E7800 Pure hypercholesterolemia, unspecified: Secondary | ICD-10-CM | POA: Diagnosis not present

## 2020-06-03 DIAGNOSIS — H903 Sensorineural hearing loss, bilateral: Secondary | ICD-10-CM | POA: Diagnosis not present

## 2020-06-03 DIAGNOSIS — I96 Gangrene, not elsewhere classified: Secondary | ICD-10-CM | POA: Diagnosis not present

## 2020-06-03 DIAGNOSIS — I4811 Longstanding persistent atrial fibrillation: Secondary | ICD-10-CM | POA: Diagnosis not present

## 2020-06-03 DIAGNOSIS — M79674 Pain in right toe(s): Secondary | ICD-10-CM | POA: Diagnosis not present

## 2020-06-03 DIAGNOSIS — I75021 Atheroembolism of right lower extremity: Secondary | ICD-10-CM | POA: Diagnosis not present

## 2020-06-05 DIAGNOSIS — L821 Other seborrheic keratosis: Secondary | ICD-10-CM | POA: Diagnosis not present

## 2020-06-05 DIAGNOSIS — C44629 Squamous cell carcinoma of skin of left upper limb, including shoulder: Secondary | ICD-10-CM | POA: Diagnosis not present

## 2020-06-05 DIAGNOSIS — Z85828 Personal history of other malignant neoplasm of skin: Secondary | ICD-10-CM | POA: Diagnosis not present

## 2020-06-05 DIAGNOSIS — Z08 Encounter for follow-up examination after completed treatment for malignant neoplasm: Secondary | ICD-10-CM | POA: Diagnosis not present

## 2020-06-05 DIAGNOSIS — D485 Neoplasm of uncertain behavior of skin: Secondary | ICD-10-CM | POA: Diagnosis not present

## 2020-06-05 DIAGNOSIS — L570 Actinic keratosis: Secondary | ICD-10-CM | POA: Diagnosis not present

## 2020-09-27 IMAGING — DX CHEST - 2 VIEW
2 series · 2 of 2 positions shown · non-contrast
Comparison: Chest x-ray 02/09/2018.

CLINICAL DATA: Preop testing for renal stone surgery. History of
pacemaker.

EXAM:
CHEST - 2 VIEW

[dg chest 2 view (1 of 2)]
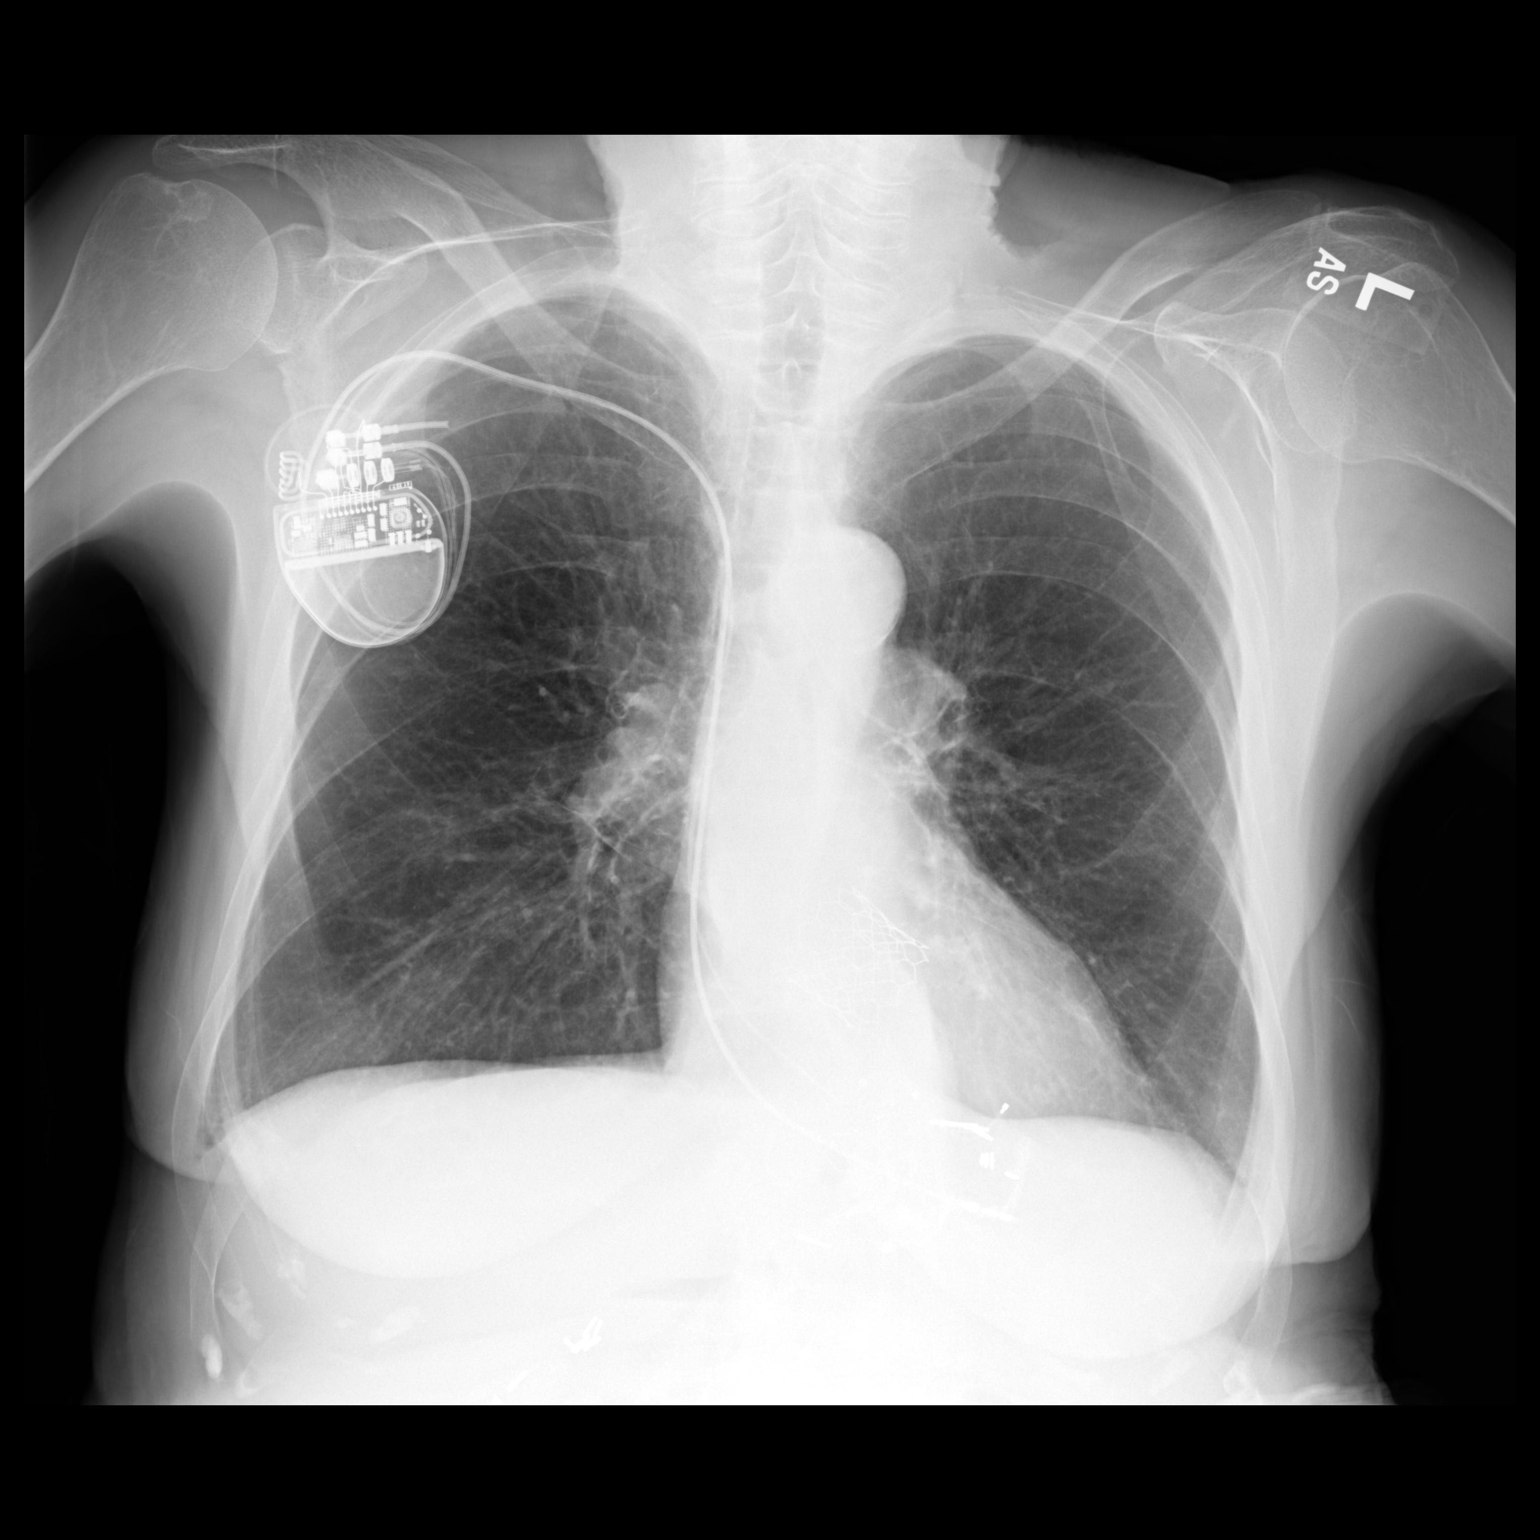

[dg chest 2 view (2 of 2)]
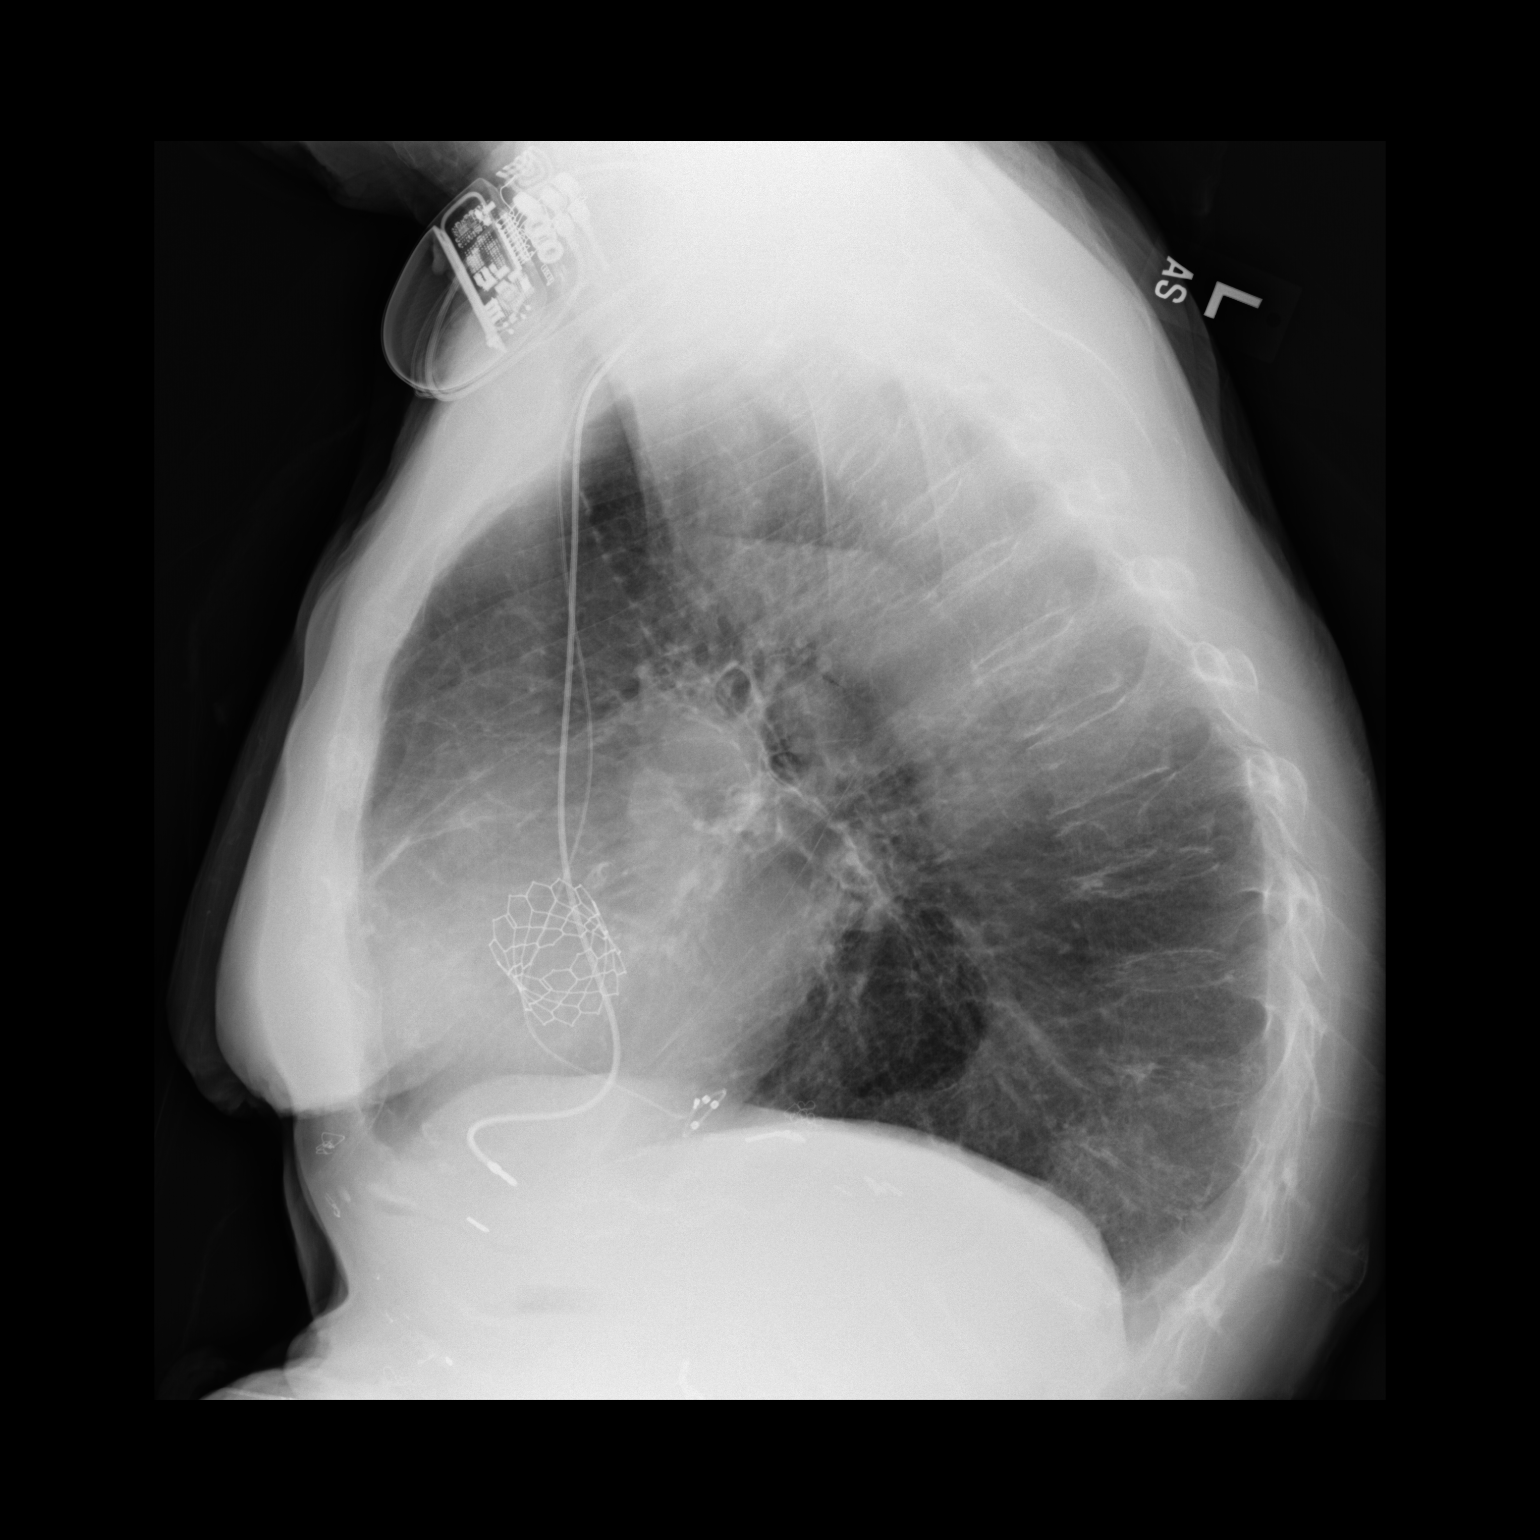

[2 of 2 positions shown; findings below may reference images not displayed]

FINDINGS: Pacemaker in stable position. Prior cardiac valve replacement again
noted. Heart size normal. Stable mild bilateral peribronchial
cuffing. No acute infiltrate. No pleural effusion or pneumothorax.
Diffuse thoracic cage osteopenia and kyphoscoliosis again noted.
Stable midthoracic vertebral body compression fracture.
IMPRESSION: Cardiac pacer and stable position. Prior cardiac valve replacement.
Heart size normal. No acute pulmonary disease. Chest is stable from
prior exam.

## 2024-03-07 DEATH — deceased
